# Patient Record
Sex: Female | Born: 1960 | Race: White | Hispanic: No | Smoking: Never smoker
Health system: Southern US, Community
[De-identification: ages and names within clinical notes are randomized; demographics above are authoritative.]

## PROBLEM LIST (undated history)

## (undated) DIAGNOSIS — R000 Tachycardia, unspecified: Secondary | ICD-10-CM

## (undated) DIAGNOSIS — F329 Major depressive disorder, single episode, unspecified: Secondary | ICD-10-CM

## (undated) DIAGNOSIS — F32A Depression, unspecified: Secondary | ICD-10-CM

## (undated) DIAGNOSIS — E7800 Pure hypercholesterolemia, unspecified: Secondary | ICD-10-CM

## (undated) DIAGNOSIS — I471 Supraventricular tachycardia, unspecified: Secondary | ICD-10-CM

## (undated) HISTORY — PX: EYE SURGERY: SHX253

## (undated) HISTORY — PX: UTERINE FIBROID SURGERY: SHX826

## (undated) HISTORY — PX: ANKLE SURGERY: SHX546

---

## 2006-10-12 ENCOUNTER — Emergency Department (HOSPITAL_COMMUNITY): Admission: EM | Admit: 2006-10-12 | Discharge: 2006-10-12 | Payer: Self-pay | Admitting: Emergency Medicine

## 2007-05-13 ENCOUNTER — Emergency Department (HOSPITAL_COMMUNITY): Admission: EM | Admit: 2007-05-13 | Discharge: 2007-05-13 | Payer: Self-pay | Admitting: Emergency Medicine

## 2008-02-22 ENCOUNTER — Emergency Department (HOSPITAL_COMMUNITY): Admission: EM | Admit: 2008-02-22 | Discharge: 2008-02-22 | Payer: Self-pay | Admitting: Emergency Medicine

## 2011-01-02 ENCOUNTER — Emergency Department (HOSPITAL_COMMUNITY)
Admission: EM | Admit: 2011-01-02 | Discharge: 2011-01-02 | Disposition: A | Discharge status: Home / Self Care | Payer: 59 | Attending: Emergency Medicine | Admitting: Emergency Medicine

## 2011-01-02 DIAGNOSIS — I498 Other specified cardiac arrhythmias: Secondary | ICD-10-CM | POA: Insufficient documentation

## 2011-01-02 DIAGNOSIS — F3289 Other specified depressive episodes: Secondary | ICD-10-CM | POA: Insufficient documentation

## 2011-01-02 DIAGNOSIS — R079 Chest pain, unspecified: Secondary | ICD-10-CM | POA: Insufficient documentation

## 2011-01-02 DIAGNOSIS — F329 Major depressive disorder, single episode, unspecified: Secondary | ICD-10-CM | POA: Insufficient documentation

## 2011-01-02 LAB — DIFFERENTIAL
Basophils Relative: 0 % (ref 0–1)
Eosinophils Absolute: 0.3 10*3/uL (ref 0.0–0.7)
Monocytes Absolute: 0.5 10*3/uL (ref 0.1–1.0)
Monocytes Relative: 5 % (ref 3–12)

## 2011-01-02 LAB — COMPREHENSIVE METABOLIC PANEL
ALT: 22 U/L (ref 0–35)
CO2: 25 mEq/L (ref 19–32)
Calcium: 9.2 mg/dL (ref 8.4–10.5)
Chloride: 109 mEq/L (ref 96–112)
GFR calc non Af Amer: >60 mL/min (ref >60)
Glucose, Bld: 108 mg/dL — ABNORMAL HIGH (ref 70–99)
Sodium: 141 mEq/L (ref 135–145)
Total Bilirubin: 0.3 mg/dL (ref 0.3–1.2)

## 2011-01-02 LAB — POCT I-STAT, CHEM 8
Calcium, Ion: 1.16 mmol/L (ref 1.12–1.32)
Creatinine, Ser: 1 mg/dL (ref 0.4–1.2)
Hemoglobin: 14.3 g/dL (ref 12.0–15.0)
Sodium: 142 mEq/L (ref 135–145)
TCO2: 24 mmol/L (ref 0–100)

## 2011-01-02 LAB — POCT CARDIAC MARKERS: Myoglobin, poc: 58.7 ng/mL (ref 12–200)

## 2011-01-02 LAB — CBC
MCH: 30 pg (ref 26.0–34.0)
MCHC: 32.8 g/dL (ref 30.0–36.0)
Platelets: 286 10*3/uL (ref 150–400)

## 2011-07-30 LAB — RAPID STREP SCREEN (MED CTR MEBANE ONLY): Streptococcus, Group A Screen (Direct): NEGATIVE

## 2011-08-20 LAB — I-STAT 8, (EC8 V) (CONVERTED LAB)
BUN: 9
Chloride: 106
HCT: 41
Hemoglobin: 13.9
Operator id: 285841
Potassium: 3.9
Sodium: 140

## 2011-08-20 LAB — POCT I-STAT CREATININE: Creatinine, Ser: 0.7

## 2011-08-20 LAB — POCT CARDIAC MARKERS: CKMB, poc: 1.4

## 2012-11-03 NOTE — H&P (Signed)
Charese Abundis  DICTATION # [id number] CSN# [id number]   Meriel Pica, MD 11/03/2012 9:54 AM

## 2012-11-03 NOTE — Progress Notes (Signed)
Addendum to H&P>>>      abd [place] dated 02/2012 showed nl pelvic findings with diffuse liver changes c/w fatty liver changes.

## 2012-11-05 NOTE — H&P (Signed)
Christine Fletcher, Christine Fletcher               ACCOUNT NO.:  [id number]  MEDICAL RECORD NO.:  [id number]  LOCATION:                                 FACILITY:  PHYSICIAN:  Duke Salvia. Christine Fletcher, M.D.DATE OF BIRTH:  02/03/1961  DATE OF ADMISSION: DATE OF DISCHARGE:                             HISTORY & PHYSICAL   CHIEF COMPLAINT:  Dyspareunia, uterine prolapse.  HISTORY OF PRESENT ILLNESS:  A 52 year old postmenopausal, G0, P0.  The patient was seen by her family doctor in early 2013, complaining of dyspareunia and the sensation that something was falling out.  No history of issue.  Her GYN history is significant for Pfannenstiel myomectomy in 1997.  Review of that operative notes from [facility], [place], Dr. Kyla Balzarine in 1997, revealed that she had a solitary large pedunculated posterior leiomyoma on a pedicle that was ligated and removed, no other abnormalities were noted.  The remainder of the pelvic exam was normal.  Examination in our office revealed that she does have mild-to-moderate uterine descensus.  The uterus was otherwise normal size with a cystocele on straining, her posterior support was normal. Significant medical history reveals history of alcoholism.  She has been sober since 2004.  This procedure including specific risks related to bleeding, infection, transfusion, the possible need to complete surgery by open technique, other risks related to wound infection, phlebitis, and her expected recovery time, possible need for ERT reviewed with her, which she understands and accepts.  PAST MEDICAL HISTORY:  ALLERGIES:  MACROBID.  CURRENT MEDICATIONS:  Prilosec, atenolol, calcium, vitamin D, clobetasol topical, Prozac, Mobic, Pravachol, and Wellbutrin.  SURGICAL HISTORY:  She had eye surgery in 1983, myomectomy in 1997, broken ankle fixation in 2005.  REVIEW OF SYSTEMS:  Otherwise remarkable for history of heart disease, arthritis.  FAMILY HISTORY:  Significant for heart  disease, asthma, peptic ulcer, UTI, diverticulosis, arthritis, diabetes, hypertension, and cervical and colon cancer.  SOCIAL HISTORY:  Christine Fletcher, nurse practitioner is her medical doctor.  She is widowed.  Denies alcohol, drug, or tobacco use.  She states that she has been sober since 2004.  PHYSICAL EXAMINATION:  VITAL SIGNS:  Temperature 98.2, blood pressure 128/90. HEENT:  Unremarkable. NECK:  Supple without masses. LUNGS:  Clear. CARDIOVASCULAR:  Regular rate and rhythm without murmurs, rubs, or gallops. BREASTS:  Without masses. ABDOMEN:  Soft, flat, and nontender.  Vulva unremarkable.  Uterus is normal size.  I am straining the uterus dropped to mild-to-moderate degree with an anterior cystocele noted.  Bimanual otherwise unremarkable.  IMPRESSION:  Uterine descensus with dyspareunia and cystocele, symptomatic.  PLAN:  LAVH, BSO, anterior repair.  Procedure and risks discussed as above.     Christine Fletcher, M.D.     [facility]  D:  11/03/2012  T:  11/03/2012  Job:  [id number]

## 2012-11-09 ENCOUNTER — Encounter (HOSPITAL_COMMUNITY): Admission: RE | Payer: Self-pay | Source: Ambulatory Visit

## 2012-11-09 ENCOUNTER — Ambulatory Visit (HOSPITAL_COMMUNITY): Admission: RE | Admit: 2012-11-09 | Payer: 59 | Source: Ambulatory Visit | Admitting: Obstetrics and Gynecology

## 2012-11-09 SURGERY — HYSTERECTOMY, VAGINAL, LAPAROSCOPY-ASSISTED
Anesthesia: General

## 2013-02-12 ENCOUNTER — Other Ambulatory Visit: Payer: Self-pay | Admitting: Family Medicine

## 2013-02-12 DIAGNOSIS — Z780 Asymptomatic menopausal state: Secondary | ICD-10-CM

## 2013-02-12 DIAGNOSIS — Z1231 Encounter for screening mammogram for malignant neoplasm of breast: Secondary | ICD-10-CM

## 2013-03-15 ENCOUNTER — Other Ambulatory Visit: Payer: 59

## 2013-03-15 ENCOUNTER — Ambulatory Visit: Payer: 59

## 2013-04-16 ENCOUNTER — Ambulatory Visit: Payer: 59

## 2013-04-16 ENCOUNTER — Other Ambulatory Visit: Payer: 59

## 2014-09-05 ENCOUNTER — Encounter (HOSPITAL_COMMUNITY): Payer: Self-pay

## 2014-09-05 ENCOUNTER — Emergency Department (HOSPITAL_COMMUNITY)
Admission: EM | Admit: 2014-09-05 | Discharge: 2014-09-05 | Disposition: A | Discharge status: Home / Self Care | Payer: 59 | Attending: Emergency Medicine | Admitting: Emergency Medicine

## 2014-09-05 ENCOUNTER — Emergency Department (HOSPITAL_COMMUNITY): Payer: 59

## 2014-09-05 DIAGNOSIS — Z8639 Personal history of other endocrine, nutritional and metabolic disease: Secondary | ICD-10-CM | POA: Diagnosis not present

## 2014-09-05 DIAGNOSIS — R000 Tachycardia, unspecified: Secondary | ICD-10-CM | POA: Diagnosis present

## 2014-09-05 DIAGNOSIS — I471 Supraventricular tachycardia: Secondary | ICD-10-CM | POA: Insufficient documentation

## 2014-09-05 DIAGNOSIS — Z8659 Personal history of other mental and behavioral disorders: Secondary | ICD-10-CM | POA: Diagnosis not present

## 2014-09-05 HISTORY — DX: Pure hypercholesterolemia, unspecified: E78.00

## 2014-09-05 HISTORY — DX: Tachycardia, unspecified: R00.0

## 2014-09-05 HISTORY — DX: Major depressive disorder, single episode, unspecified: F32.9

## 2014-09-05 HISTORY — DX: Depression, unspecified: F32.A

## 2014-09-05 LAB — BASIC METABOLIC PANEL
ANION GAP: 15 (ref 5–15)
BUN: 16 mg/dL (ref 6–23)
CO2: 26 mEq/L (ref 19–32)
Calcium: 9.3 mg/dL (ref 8.4–10.5)
Chloride: 100 mEq/L (ref 96–112)
Creatinine, Ser: 0.89 mg/dL (ref 0.50–1.10)
GFR, EST AFRICAN AMERICAN: 83 mL/min — AB (ref >90)
GFR, EST NON AFRICAN AMERICAN: 72 mL/min — AB (ref >90)
Glucose, Bld: 142 mg/dL — ABNORMAL HIGH (ref 70–99)
Potassium: 3.5 mEq/L — ABNORMAL LOW (ref 3.7–5.3)
Sodium: 141 mEq/L (ref 137–147)

## 2014-09-05 LAB — CBC
HEMATOCRIT: 40.8 % (ref 36.0–46.0)
Hemoglobin: 13.8 g/dL (ref 12.0–15.0)
MCH: 29.9 pg (ref 26.0–34.0)
MCHC: 33.8 g/dL (ref 30.0–36.0)
MCV: 88.5 fL (ref 78.0–100.0)
Platelets: 362 10*3/uL (ref 150–400)
RBC: 4.61 MIL/uL (ref 3.87–5.11)
RDW: 13.4 % (ref 11.5–15.5)
WBC: 12.7 10*3/uL — ABNORMAL HIGH (ref 4.0–10.5)

## 2014-09-05 LAB — I-STAT TROPONIN, ED: Troponin i, poc: 0.02 ng/mL (ref 0.00–0.08)

## 2014-09-05 MED ORDER — ADENOSINE 6 MG/2ML IV SOLN
12.0000 mg | Freq: Once | INTRAVENOUS | Status: AC
  Administered 2014-09-05: 12 mg via INTRAVENOUS

## 2014-09-05 MED ORDER — ADENOSINE 6 MG/2ML IV SOLN
6.0000 mg | Freq: Once | INTRAVENOUS | Status: AC
  Administered 2014-09-05: 6 mg via INTRAVENOUS

## 2014-09-05 MED ORDER — ADENOSINE 6 MG/2ML IV SOLN
INTRAVENOUS | Status: AC
  Filled 2014-09-05: qty 6

## 2014-09-05 NOTE — ED Provider Notes (Signed)
CSN: [id number]     Arrival date & time 09/05/14  0130 History   First MD Initiated Contact with Patient 09/05/14 0143     Chief Complaint  Patient presents with  . Tachycardia     (Consider location/radiation/quality/duration/timing/severity/associated sxs/prior Treatment) The history is provided by the patient.  53 year old female presents with rapid heartbeat which started about 30 minutes ago. This is associated with a burning feeling in her chest and mild nausea. There's been no dyspnea or diaphoresis. She has had similar episode once before which required a trip to the hospital. She tried Valsalva maneuver,And tried splashing her face with cold water without any improvement.   Past Medical History  Diagnosis Date  . Tachycardia   . Depression   . High cholesterol    Past Surgical History  Procedure Laterality Date  . Ankle surgery    . Eye surgery      three surgeries   History reviewed. No pertinent family history. History  Substance Use Topics  . Smoking status: Never Smoker   . Smokeless tobacco: Not on file  . Alcohol Use: No   OB History    No data available     Review of Systems  All other systems reviewed and are negative.     Allergies  Macrobid  Home Medications   Prior to Admission medications   Not on File   BP 117/90 mmHg  Pulse 212  Temp(Src) 97.8 F (36.6 C) (Oral)  Resp 18  SpO2 94% Physical Exam  Nursing note and vitals reviewed.  53 year old female, resting comfortably and in no acute distress. Vital signs are significant for extreme tachycardia. Oxygen saturation is 94%, which is normal. Head is normocephalic and atraumatic. PERRLA, EOMI. Oropharynx is clear. Neck is nontender and supple without adenopathy or JVD. Back is nontender and there is no CVA tenderness. Lungs are clear without rales, wheezes, or rhonchi. Chest is nontender. Heart his tachycardic without murmur. Abdomen is soft, flat, nontender without masses or  hepatosplenomegaly and peristalsis is normoactive. Extremities have no cyanosis or edema, full range of motion is present. Skin is warm and dry without rash. Neurologic: Mental status is normal, cranial nerves are intact, there are no motor or sensory deficits.  ED Course  Procedures (including critical care time) Procedure: Chemical cardioversion Indication: Supraventricular tachycardia Prior to procedure being done, at timeout was performed Description: She was given adenosine 6 mg intravenously with no appreciable effect on her heart rate. This was followed by adenosine 12 mg with successful conversion to sinus tachycardia. Sinus tachycardia initially was at a rate of 145 but has slowly decreased to 110. She has noted subjective improvement with this. Patient tolerated procedure well.  Labs Review Results for orders placed or performed during the hospital encounter of 09/05/14  CBC  Result Value Ref Range   WBC 12.7 (H) 4.0 - 10.5 K/uL   RBC 4.61 3.87 - 5.11 MIL/uL   Hemoglobin 13.8 12.0 - 15.0 g/dL   HCT 42.740.8 06.236.0 - 37.646.0 %   MCV 88.5 78.0 - 100.0 fL   MCH 29.9 26.0 - 34.0 pg   MCHC 33.8 30.0 - 36.0 g/dL   RDW 28.313.4 15.111.5 - 76.115.5 %   Platelets 362 150 - 400 K/uL  Basic metabolic panel  Result Value Ref Range   Sodium 141 137 - 147 mEq/L   Potassium 3.5 (L) 3.7 - 5.3 mEq/L   Chloride 100 96 - 112 mEq/L   CO2 26 19 - 32  mEq/L   Glucose, Bld 142 (H) 70 - 99 mg/dL   BUN 16 6 - 23 mg/dL   Creatinine, Ser 1.610.89 0.50 - 1.10 mg/dL   Calcium 9.3 8.4 - 09.610.5 mg/dL   GFR calc non Af Amer 72 (L) >90 mL/min   GFR calc Af Amer 83 (L) >90 mL/min   Anion gap 15 5 - 15  I-stat troponin, ED ([facility])  Result Value Ref Range   Troponin i, poc 0.02 0.00 - 0.08 ng/mL   Comment 3           EKG Interpretation   Date/Time:  Monday September 05 2014 01:40:25 EST Ventricular Rate:  215 PR Interval:    QRS Duration: 82 QT Interval:  226 QTC Calculation: 427 R Axis:   174 Text Interpretation:   Supraventricular tachycardia Right axis deviation  Pulmonary disease pattern Right ventricular hypertrophy Nonspecific ST  abnormality Abnormal ECG When compared with ECG of 01/02/2011, No  significant change was found Confirmed by [facility]  MD, Kollyns Mickelson ([id number]) on  09/05/2014 1:45:34 AM      CRITICAL CARE Performed by: JWJXB,JYNWGGLICK,Samaria Anes Total critical care time: 35 minutes Critical care time was exclusive of separately billable procedures and treating other patients. Critical care was necessary to treat or prevent imminent or life-threatening deterioration. Critical care was time spent personally by me on the following activities: development of treatment plan with patient and/or surrogate as well as nursing, discussions with consultants, evaluation of patient's response to treatment, examination of patient, obtaining history from patient or surrogate, ordering and performing treatments and interventions, ordering and review of laboratory studies, ordering and review of radiographic studies, pulse oximetry and re-evaluation of patient's condition.  MDM   Final diagnoses:  None    Paroxysmal supraventricular tachycardia. Old records are reviewed and she has a prior ED visit for similar condition. She is given 2 doses of adenosine with successful conversion to sinus rhythm.  2:43 AM Patient is resting comfortably in heart rate is down to 95. She has had no further symptoms and is discharged.  Dione Boozeavid Evadean Sproule, MD 09/05/14 [phone]

## 2014-09-05 NOTE — Discharge Instructions (Signed)
Supraventricular Tachycardia °Supraventricular tachycardia (SVT) is an abnormal heart rhythm (arrhythmia) that causes the heart to beat very fast (tachycardia). This kind of fast heartbeat originates in the upper chambers of the heart (atria). SVT can cause the heart to beat greater than 100 beats per minute. SVT can have a rapid burst of heartbeats. This can start and stop suddenly without warning and is called nonsustained. SVT can also be sustained, in which the heart beats at a continuous fast rate.  °CAUSES  °There can be different causes of SVT. Some of these include: °· Heart valve problems such as mitral valve prolapse. °· An enlarged heart (hypertrophic cardiomyopathy). °· Congenital heart problems. °· Heart inflammation (pericarditis). °· Hyperthyroidism. °· Low potassium or magnesium levels. °· Caffeine. °· Drug use such as cocaine, methamphetamines, or stimulants. °· Some over-the-counter medicines such as: °¨ Decongestants. °¨ Diet medicines. °¨ Herbal medicines. °SYMPTOMS  °Symptoms of SVT can vary. Symptoms depend on whether the SVT is sustained or nonsustained. You may experience: °· No symptoms (asymptomatic). °· An awareness of your heart beating rapidly (palpitations). °· Shortness of breath. °· Chest pain or pressure. °If your blood pressure drops because of the SVT, you may experience: °· Fainting or near fainting. °· Weakness. °· Dizziness. °DIAGNOSIS  °Different tests can be performed to diagnose SVT, such as: °· An electrocardiogram (EKG). This is a painless test that records the electrical activity of your heart. °· Holter monitor. This is a 24 hour recording of your heart rhythm. You will be given a diary. Write down all symptoms that you have and what you were doing at the time you experienced symptoms. °· Arrhythmia monitor. This is a small device that your wear for several weeks. It records the heart rhythm when you have symptoms. °· Echocardiogram. This is an imaging test to help detect  abnormal heart structure such as congenital abnormalities, heart valve problems, or heart enlargement. °· Stress test. This test can help determine if the SVT is related to exercise. °· Electrophysiology study (EPS). This is a procedure that evaluates your heart's electrical system and can help your caregiver find the cause of your SVT. °TREATMENT  °Treatment of SVT depends on the symptoms, how often it recurs, and whether there are any underlying heart problems.  °· If symptoms are rare and no other cardiac disease is present, no treatment may be needed. °· Blood work may be done to check potassium, magnesium, and thyroid hormone levels to see if they are abnormal. If these levels are abnormal, treatment to correct the problems will occur. °Medicines °Your caregiver may use oral medicines to treat SVT. These medicines are given for long-term control of SVT. Medicines may be used alone or in combination with other treatments. These medicines work to slow nerve impulses in the heart muscle. These medicines can also be used to treat high blood pressure. Some of these medicines may include: °· Calcium channel blockers. °· Beta blockers. °· Digoxin. °Nonsurgical procedures °Nonsurgical techniques may be used if oral medicines do not work. Some examples include: °· Cardioversion. This technique uses either drugs or an electrical shock to restore a normal heart rhythm. °¨ Cardioversion drugs may be given through an intravenous (IV) line to help "reset" the heart rhythm. °¨ In electrical cardioversion, the caregiver shocks your heart to stop its beat for a split second. This helps to reset the heart to a normal rhythm. °· Ablation. This procedure is done under mild sedation. High frequency radio wave energy is used to   destroy the area of heart tissue responsible for the SVT. °HOME CARE INSTRUCTIONS  °· Do not smoke. °· Only take medicines prescribed by your caregiver. Check with your caregiver before using over-the-counter  medicines. °· Check with your caregiver about how much alcohol and caffeine (coffee, tea, colas, or chocolate) you may have. °· It is very important to keep all follow-up referrals and appointments in order to properly manage this problem. °SEEK IMMEDIATE MEDICAL CARE IF: °· You have dizziness. °· You faint or nearly faint. °· You have shortness of breath. °· You have chest pain or pressure. °· You have sudden nausea or vomiting. °· You have profuse sweating. °· You are concerned about how long your symptoms last. °· You are concerned about the frequency of your SVT episodes. °If you have the above symptoms, call your local emergency services (911 in [place] immediately. Do not drive yourself to the hospital. °MAKE SURE YOU:  °· Understand these instructions. °· Will watch your condition. °· Will get help right away if you are not doing well or get worse. °Document Released: 10/21/2005 Document Revised: 01/13/2012 Document Reviewed: 02/02/2009 °ExitCare® Patient Information ©2015 [employer]. This information is not intended to replace advice given to you by your health care provider. Make sure you discuss any questions you have with your health care provider. ° °

## 2014-09-05 NOTE — ED Notes (Signed)
Pt presents with a "tachtcardia." Pt states it feels like her heart is "running" and "burning" to her chest. Pt denies SOB, diaphoresis, N/V. Pt's HR is 215 in triage, EKG  Completed in triage

## 2014-09-05 NOTE — ED Notes (Signed)
HR 113

## 2014-09-05 NOTE — ED Notes (Signed)
HR remains 210

## 2015-06-16 ENCOUNTER — Emergency Department (HOSPITAL_COMMUNITY)
Admission: EM | Admit: 2015-06-16 | Discharge: 2015-06-16 | Disposition: A | Discharge status: Home / Self Care | Payer: 59 | Attending: Emergency Medicine | Admitting: Emergency Medicine

## 2015-06-16 ENCOUNTER — Encounter (HOSPITAL_COMMUNITY): Payer: Self-pay

## 2015-06-16 DIAGNOSIS — Z79899 Other long term (current) drug therapy: Secondary | ICD-10-CM | POA: Insufficient documentation

## 2015-06-16 DIAGNOSIS — F329 Major depressive disorder, single episode, unspecified: Secondary | ICD-10-CM | POA: Insufficient documentation

## 2015-06-16 DIAGNOSIS — E780 Pure hypercholesterolemia: Secondary | ICD-10-CM | POA: Insufficient documentation

## 2015-06-16 DIAGNOSIS — I471 Supraventricular tachycardia: Secondary | ICD-10-CM

## 2015-06-16 HISTORY — DX: Supraventricular tachycardia, unspecified: I47.10

## 2015-06-16 HISTORY — DX: Supraventricular tachycardia: I47.1

## 2015-06-16 LAB — I-STAT CHEM 8, ED
BUN: 18 mg/dL (ref 6–20)
CREATININE: 0.7 mg/dL (ref 0.44–1.00)
Calcium, Ion: 1.16 mmol/L (ref 1.12–1.23)
Chloride: 108 mmol/L (ref 101–111)
Glucose, Bld: 91 mg/dL (ref 65–99)
HCT: 39 % (ref 36.0–46.0)
Hemoglobin: 13.3 g/dL (ref 12.0–15.0)
Potassium: 3.8 mmol/L (ref 3.5–5.1)
Sodium: 141 mmol/L (ref 135–145)
TCO2: 21 mmol/L (ref 0–100)

## 2015-06-16 LAB — I-STAT TROPONIN, ED: TROPONIN I, POC: 0.01 ng/mL (ref 0.00–0.08)

## 2015-06-16 NOTE — ED Notes (Signed)
Pt. Ambulated independently to the bathroom with steady gait.

## 2015-06-16 NOTE — ED Provider Notes (Signed)
CSN: [id number]     Arrival date & time 06/16/15  0906 History   First MD Initiated Contact with Patient 06/16/15 [phone]     Chief Complaint  Patient presents with  . Tachycardia     The history is provided by the patient. No language interpreter was used.   Christine Fletcher presents for evaluation of rapid heartbeat. At 7:30 this morning she developed palpitations and called 911. On EMS arrival she had SVT with a heart rate in the 200s. She was noted to be pale and diaphoretic per EMS. She received 6 mg of Benicar and prior to ED arrival with conversion to a sinus tach, rate in the low 100s. Patient reports she is currently asymptomatic. She has had some diarrhea for the last 2 days, no hematochezia. She denies any chest pain, shortness of breath, fevers, vomiting, dysuria. She has no other medical problems and takes no prescribed medications. She does not smoke, she is an occasional alcohol drinker, no drug use.  Past Medical History  Diagnosis Date  . Tachycardia   . Depression   . High cholesterol   . SVT (supraventricular tachycardia)    Past Surgical History  Procedure Laterality Date  . Ankle surgery    . Eye surgery      three surgeries   No family history on file. Social History  Substance Use Topics  . Smoking status: Never Smoker   . Smokeless tobacco: None  . Alcohol Use: Yes     Comment: occasionally   OB History    No data available     Review of Systems  All other systems reviewed and are negative.     Allergies  Macrobid  Home Medications   Prior to Admission medications   Medication Sig Start Date End Date Taking? Authorizing Provider  atenolol (TENORMIN) 25 MG tablet Take 25 mg by mouth daily.    Historical Provider, MD  buPROPion (WELLBUTRIN SR) 150 MG 12 hr tablet Take 150 mg by mouth 2 (two) times daily.    Historical Provider, MD  Calcium Citrate-Vitamin D (CALCIUM CITRATE + D3 PO) Take 2 tablets by mouth 2 (two) times daily.    Historical Provider,  MD  Cholecalciferol (VITAMIN D-3) 1000 UNITS CAPS Take 1,000 Units by mouth.    Historical Provider, MD  diclofenac (VOLTAREN) 75 MG EC tablet Take 75 mg by mouth daily.    Historical Provider, MD  FLUoxetine (PROZAC) 40 MG capsule Take 40 mg by mouth daily.    Historical Provider, MD  ibuprofen (ADVIL,MOTRIN) 200 MG tablet Take 600 mg by mouth every 6 (six) hours as needed for moderate pain.    Historical Provider, MD  Omega-3 Fatty Acids (FISH OIL) 1200 MG CAPS Take 2,400 mg by mouth daily.    Historical Provider, MD  omeprazole (PRILOSEC) 40 MG capsule Take 40 mg by mouth daily.    Historical Provider, MD  pravastatin (PRAVACHOL) 40 MG tablet Take 40 mg by mouth every other day.    Historical Provider, MD   BP 110/63 mmHg  Pulse 100  Temp(Src) 97.7 F (36.5 C) (Oral)  Resp 24  SpO2 97% Physical Exam  Constitutional: She is oriented to person, place, and time. She appears well-developed and well-nourished.  HENT:  Head: Normocephalic and atraumatic.  Cardiovascular: Regular rhythm.   No murmur heard. Tachycardic  Pulmonary/Chest: Effort normal and breath sounds normal. No respiratory distress.  Abdominal: Soft. There is no tenderness. There is no rebound and no guarding.  Musculoskeletal:  She exhibits no edema or tenderness.  Neurological: She is alert and oriented to person, place, and time.  Skin: Skin is warm and dry.  Psychiatric: She has a normal mood and affect. Her behavior is normal.  Nursing note and vitals reviewed.   ED Course  Procedures (including critical care time) Labs Review Labs Reviewed  I-STAT CHEM 8, ED  I-STAT TROPOININ, ED    Imaging Review No results found. I, Kauan Kloosterman, personally reviewed and evaluated these images and lab results as part of my medical decision-making.   EKG Interpretation   Date/Time:  Friday June 16 2015 09:14:05 EDT Ventricular Rate:  101 PR Interval:  170 QRS Duration: 92 QT Interval:  352 QTC Calculation:  456 R Axis:   121 Text Interpretation:  Sinus tachycardia Right axis deviation RSR' in V1 or  V2, probably normal variant Baseline wander in lead(s) II III aVF  Confirmed by Lincoln Brigham [phone]) on 06/16/2015 9:22:22 AM      MDM   Final diagnoses:  SVT (supraventricular tachycardia)    Patient here for evaluation of SVT/tachycardia. She was chemically converted by EMS prior to arrival. Reviewed patient's EMS run strips that noted narrow complex SVT. Patient is asymptomatic in the emergency department with no evidence of major electrolyte abnormalities. Clinical picture is not consistent with ACS, PE, CHF. Discussed the patient home care for SVT, cardiology follow-up, return precautions.    Tilden Fossa, MD 06/16/15 1108

## 2015-06-16 NOTE — Discharge Instructions (Signed)
Supraventricular Tachycardia °Supraventricular tachycardia (SVT) is an abnormal heart rhythm (arrhythmia) that causes the heart to beat very fast (tachycardia). This kind of fast heartbeat originates in the upper chambers of the heart (atria). SVT can cause the heart to beat greater than 100 beats per minute. SVT can have a rapid burst of heartbeats. This can start and stop suddenly without warning and is called nonsustained. SVT can also be sustained, in which the heart beats at a continuous fast rate.  °CAUSES  °There can be different causes of SVT. Some of these include: °· Heart valve problems such as mitral valve prolapse. °· An enlarged heart (hypertrophic cardiomyopathy). °· Congenital heart problems. °· Heart inflammation (pericarditis). °· Hyperthyroidism. °· Low potassium or magnesium levels. °· Caffeine. °· Drug use such as cocaine, methamphetamines, or stimulants. °· Some over-the-counter medicines such as: °¨ Decongestants. °¨ Diet medicines. °¨ Herbal medicines. °SYMPTOMS  °Symptoms of SVT can vary. Symptoms depend on whether the SVT is sustained or nonsustained. You may experience: °· No symptoms (asymptomatic). °· An awareness of your heart beating rapidly (palpitations). °· Shortness of breath. °· Chest pain or pressure. °If your blood pressure drops because of the SVT, you may experience: °· Fainting or near fainting. °· Weakness. °· Dizziness. °DIAGNOSIS  °Different tests can be performed to diagnose SVT, such as: °· An electrocardiogram (EKG). This is a painless test that records the electrical activity of your heart. °· Holter monitor. This is a 24 hour recording of your heart rhythm. You will be given a diary. Write down all symptoms that you have and what you were doing at the time you experienced symptoms. °· Arrhythmia monitor. This is a small device that your wear for several weeks. It records the heart rhythm when you have symptoms. °· Echocardiogram. This is an imaging test to help detect  abnormal heart structure such as congenital abnormalities, heart valve problems, or heart enlargement. °· Stress test. This test can help determine if the SVT is related to exercise. °· Electrophysiology study (EPS). This is a procedure that evaluates your heart's electrical system and can help your caregiver find the cause of your SVT. °TREATMENT  °Treatment of SVT depends on the symptoms, how often it recurs, and whether there are any underlying heart problems.  °· If symptoms are rare and no other cardiac disease is present, no treatment may be needed. °· Blood work may be done to check potassium, magnesium, and thyroid hormone levels to see if they are abnormal. If these levels are abnormal, treatment to correct the problems will occur. °Medicines °Your caregiver may use oral medicines to treat SVT. These medicines are given for long-term control of SVT. Medicines may be used alone or in combination with other treatments. These medicines work to slow nerve impulses in the heart muscle. These medicines can also be used to treat high blood pressure. Some of these medicines may include: °· Calcium channel blockers. °· Beta blockers. °· Digoxin. °Nonsurgical procedures °Nonsurgical techniques may be used if oral medicines do not work. Some examples include: °· Cardioversion. This technique uses either drugs or an electrical shock to restore a normal heart rhythm. °¨ Cardioversion drugs may be given through an intravenous (IV) line to help "reset" the heart rhythm. °¨ In electrical cardioversion, the caregiver shocks your heart to stop its beat for a split second. This helps to reset the heart to a normal rhythm. °· Ablation. This procedure is done under mild sedation. High frequency radio wave energy is used to   destroy the area of heart tissue responsible for the SVT. °HOME CARE INSTRUCTIONS  °· Do not smoke. °· Only take medicines prescribed by your caregiver. Check with your caregiver before using over-the-counter  medicines. °· Check with your caregiver about how much alcohol and caffeine (coffee, tea, colas, or chocolate) you may have. °· It is very important to keep all follow-up referrals and appointments in order to properly manage this problem. °SEEK IMMEDIATE MEDICAL CARE IF: °· You have dizziness. °· You faint or nearly faint. °· You have shortness of breath. °· You have chest pain or pressure. °· You have sudden nausea or vomiting. °· You have profuse sweating. °· You are concerned about how long your symptoms last. °· You are concerned about the frequency of your SVT episodes. °If you have the above symptoms, call your local emergency services (911 in [place] immediately. Do not drive yourself to the hospital. °MAKE SURE YOU:  °· Understand these instructions. °· Will watch your condition. °· Will get help right away if you are not doing well or get worse. °Document Released: 10/21/2005 Document Revised: 01/13/2012 Document Reviewed: 02/02/2009 °ExitCare® Patient Information ©2015 [employer]. This information is not intended to replace advice given to you by your health care provider. Make sure you discuss any questions you have with your health care provider. ° °

## 2015-06-16 NOTE — ED Notes (Signed)
Pt. Presents for SVT. Rate initially 204, [facility] administered 324 ASA and 6 mg of adenosine, pt. Converted PTA. HR now 94. Pt. With positive hx of SVT episodes, never had ablation or been electrically converted. Pt. Found to be diaphoretic with CP on EMS arrival. All symptoms resolved at this time.

## 2016-05-24 ENCOUNTER — Emergency Department (HOSPITAL_COMMUNITY)
Admission: EM | Admit: 2016-05-24 | Discharge: 2016-05-24 | Disposition: A | Discharge status: Home / Self Care | Payer: BLUE CROSS/BLUE SHIELD | Attending: Emergency Medicine | Admitting: Emergency Medicine

## 2016-05-24 ENCOUNTER — Encounter (HOSPITAL_COMMUNITY): Payer: Self-pay | Admitting: Emergency Medicine

## 2016-05-24 DIAGNOSIS — S80862A Insect bite (nonvenomous), left lower leg, initial encounter: Secondary | ICD-10-CM | POA: Diagnosis present

## 2016-05-24 DIAGNOSIS — F329 Major depressive disorder, single episode, unspecified: Secondary | ICD-10-CM | POA: Insufficient documentation

## 2016-05-24 DIAGNOSIS — Y929 Unspecified place or not applicable: Secondary | ICD-10-CM | POA: Diagnosis not present

## 2016-05-24 DIAGNOSIS — L0291 Cutaneous abscess, unspecified: Secondary | ICD-10-CM

## 2016-05-24 DIAGNOSIS — L02416 Cutaneous abscess of left lower limb: Secondary | ICD-10-CM | POA: Diagnosis not present

## 2016-05-24 DIAGNOSIS — Y999 Unspecified external cause status: Secondary | ICD-10-CM | POA: Insufficient documentation

## 2016-05-24 DIAGNOSIS — Y939 Activity, unspecified: Secondary | ICD-10-CM | POA: Diagnosis not present

## 2016-05-24 DIAGNOSIS — W57XXXA Bitten or stung by nonvenomous insect and other nonvenomous arthropods, initial encounter: Secondary | ICD-10-CM | POA: Diagnosis not present

## 2016-05-24 DIAGNOSIS — L0390 Cellulitis, unspecified: Secondary | ICD-10-CM

## 2016-05-24 MED ORDER — DOXYCYCLINE HYCLATE 100 MG PO CAPS: 100.0000 mg | ORAL_CAPSULE | Freq: Two times a day (BID) | ORAL | Status: DC

## 2016-05-24 MED ORDER — SODIUM BICARBONATE 4 % IV SOLN
5.0000 mL | Freq: Once | INTRAVENOUS | Status: AC
  Administered 2016-05-24: 5 mL via SUBCUTANEOUS
  Filled 2016-05-24 (×2): qty 5

## 2016-05-24 MED ORDER — ONDANSETRON 4 MG PO TBDP
4.0000 mg | ORAL_TABLET | Freq: Once | ORAL | Status: AC
  Administered 2016-05-24: 4 mg via ORAL
  Filled 2016-05-24: qty 1

## 2016-05-24 MED ORDER — LIDOCAINE HCL 1 % IJ SOLN
10.0000 mL | Freq: Once | INTRAMUSCULAR | Status: AC
  Administered 2016-05-24: 10 mL
  Filled 2016-05-24: qty 20

## 2016-05-24 MED ORDER — ONDANSETRON HCL 4 MG/2ML IJ SOLN
4.0000 mg | Freq: Once | INTRAMUSCULAR | Status: DC
  Filled 2016-05-24: qty 2

## 2016-05-24 MED ORDER — TRAMADOL HCL 50 MG PO TABS: 50.0000 mg | ORAL_TABLET | Freq: Four times a day (QID) | ORAL | Status: DC | PRN

## 2016-05-24 NOTE — Discharge Instructions (Signed)
Abscess °An abscess is an infected area that contains a collection of pus and debris. It can occur in almost any part of the body. An abscess is also known as a furuncle or boil. °CAUSES  °An abscess occurs when tissue gets infected. This can occur from blockage of oil or sweat glands, infection of hair follicles, or a minor injury to the skin. As the body tries to fight the infection, pus collects in the area and creates pressure under the skin. This pressure causes pain. People with weakened immune systems have difficulty fighting infections and get certain abscesses more often.  °SYMPTOMS °Usually an abscess develops on the skin and becomes a painful mass that is red, warm, and tender. If the abscess forms under the skin, you may feel a moveable soft area under the skin. Some abscesses break open (rupture) on their own, but most will continue to get worse without care. The infection can spread deeper into the body and eventually into the bloodstream, causing you to feel ill.  °DIAGNOSIS  °Your caregiver will take your medical history and perform a physical exam. A sample of fluid may also be taken from the abscess to determine what is causing your infection. °TREATMENT  °Your caregiver may prescribe antibiotic medicines to fight the infection. However, taking antibiotics alone usually does not cure an abscess. Your caregiver may need to make a small cut (incision) in the abscess to drain the pus. In some cases, gauze is packed into the abscess to reduce pain and to continue draining the area. °HOME CARE INSTRUCTIONS  °· Only take over-the-counter or prescription medicines for pain, discomfort, or fever as directed by your caregiver. °· If you were prescribed antibiotics, take them as directed. Finish them even if you start to feel better. °· If gauze is used, follow your caregiver's directions for changing the gauze. °· To avoid spreading the infection: °· Keep your draining abscess covered with a  bandage. °· Wash your hands well. °· Do not share personal care items, towels, or whirlpools with others. °· Avoid skin contact with others. °· Keep your skin and clothes clean around the abscess. °· Keep all follow-up appointments as directed by your caregiver. °SEEK MEDICAL CARE IF:  °· You have increased pain, swelling, redness, fluid drainage, or bleeding. °· You have muscle aches, chills, or a general ill feeling. °· You have a fever. °MAKE SURE YOU:  °· Understand these instructions. °· Will watch your condition. °· Will get help right away if you are not doing well or get worse. °  °This information is not intended to replace advice given to you by your health care provider. Make sure you discuss any questions you have with your health care provider. °  °Document Released: 07/31/2005 Document Revised: 04/21/2012 Document Reviewed: 01/03/2012 °Elsevier Interactive Patient Education ©2016 Elsevier Inc. ° °Cellulitis °Cellulitis is an infection of the skin and the tissue beneath it. The infected area is usually red and tender. Cellulitis occurs most often in the arms and lower legs.  °CAUSES  °Cellulitis is caused by bacteria that enter the skin through cracks or cuts in the skin. The most common types of bacteria that cause cellulitis are staphylococci and streptococci. °SIGNS AND SYMPTOMS  °· Redness and warmth. °· Swelling. °· Tenderness or pain. °· Fever. °DIAGNOSIS  °Your health care provider can usually determine what is wrong based on a physical exam. Blood tests may also be done. °TREATMENT  °Treatment usually involves taking an antibiotic medicine. °HOME CARE INSTRUCTIONS  °·   Take your antibiotic medicine as directed by your health care provider. Finish the antibiotic even if you start to feel better.  Keep the infected arm or leg elevated to reduce swelling.  Apply a warm cloth to the affected area up to 4 times per day to relieve pain.  Take medicines only as directed by your health care  provider.  Keep all follow-up visits as directed by your health care provider. SEEK MEDICAL CARE IF:   You notice red streaks coming from the infected area.  Your red area gets larger or turns dark in color.  Your bone or joint underneath the infected area becomes painful after the skin has healed.  Your infection returns in the same area or another area.  You notice a swollen bump in the infected area.  You develop new symptoms.  You have a fever. SEEK IMMEDIATE MEDICAL CARE IF:   You feel very sleepy.  You develop vomiting or diarrhea.  You have a general ill feeling (malaise) with muscle aches and pains.   This information is not intended to replace advice given to you by your health care provider. Make sure you discuss any questions you have with your health care provider.   Document Released: 07/31/2005 Document Revised: 07/12/2015 Document Reviewed: 01/06/2012 Elsevier Interactive Patient Education 2016 Elsevier Inc. Incision and Drainage, Care After Refer to this sheet in the next few weeks. These instructions provide you with information on caring for yourself after your procedure. Your caregiver may also give you more specific instructions. Your treatment has been planned according to current medical practices, but problems sometimes occur. Call your caregiver if you have any problems or questions after your procedure. HOME CARE INSTRUCTIONS   If antibiotic medicine is given, take it as directed. Finish it even if you start to feel better.  Only take over-the-counter or prescription medicines for pain, discomfort, or fever as directed by your caregiver.  Keep all follow-up appointments as directed by your caregiver.  Change any bandages (dressings) once day or more if soiled.  Replace old dressings with clean dressings.  Wash your hands before and after caring for your wound. You will receive specific instructions for cleansing and caring for your wound.  SEEK  MEDICAL CARE IF:   You have increased pain, swelling, or redness around the wound.  You have increased drainage, smell, or bleeding from the wound.  You have muscle aches, chills, or you feel generally sick.  You have a fever. MAKE SURE YOU:   Understand these instructions.  Will watch your condition.  Will get help right away if you are not doing well or get worse.   This information is not intended to replace advice given to you by your health care provider. Make sure you discuss any questions you have with your health care provider.   Document Released: 01/13/2012 Document Revised: 11/11/2014 Document Reviewed: 01/13/2012 Elsevier Interactive Patient Education [employer]2016 Elsevier Inc.

## 2016-05-24 NOTE — ED Provider Notes (Signed)
CSN: [id number]     Arrival date & time 05/24/16  1411 History  By signing my name below, I, Christine Fletcher, attest that this documentation has been prepared under the direction and in the presence of Arthor CaptainAbigail Lief Palmatier, PA-C. Electronically Signed: Randell PatientMarrissa Fletcher, ED Scribe. 05/24/2016. 5:24 PM.    Chief Complaint  Patient presents with  . Insect Bite     The history is provided by the patient. No language interpreter was used.   HPI Comments: Christine Fletcher is a 55 y.o. female who presents to the Emergency Department complaining of constant, moderately painful, gradually increasing in redness and swelling insect bite on her left anterior lower left leg that she noticed 2 days ago. Pt states that she noticed a small, flat area of redness on her left shin 2 days ago while walking her dog that has since increased in erythema, pain, and swelling. She reports associated general malaise and warmth to affected area. Pain is worse with palpation. She has scratched the area without relief. Denies fever.  Past Medical History  Diagnosis Date  . Tachycardia   . Depression   . High cholesterol   . SVT (supraventricular tachycardia) [facility](HCC)    Past Surgical History  Procedure Laterality Date  . Ankle surgery    . Eye surgery      three surgeries   No family history on file. Social History  Substance Use Topics  . Smoking status: Never Smoker   . Smokeless tobacco: None  . Alcohol Use: Yes     Comment: occasionally   OB History    No data available     Review of Systems  Constitutional: Negative for fever.  Skin: Positive for color change (erythema) and wound (boil on lower left leg).      Allergies  Macrobid  Home Medications   Prior to Admission medications   Medication Sig Start Date End Date Taking? Authorizing Provider  doxycycline (VIBRAMYCIN) 100 MG capsule Take 1 capsule (100 mg total) by mouth 2 (two) times daily. 05/24/16   Arthor CaptainAbigail Eveny Anastas, PA-C  ibuprofen  (ADVIL,MOTRIN) 200 MG tablet Take 600 mg by mouth every 6 (six) hours as needed for moderate pain.    Historical Provider, MD  omeprazole (PRILOSEC) 40 MG capsule Take 40 mg by mouth daily.    Historical Provider, MD  traMADol (ULTRAM) 50 MG tablet Take 1 tablet (50 mg total) by mouth every 6 (six) hours as needed. 05/24/16   Iyanna Drummer, PA-C   BP 152/74 mmHg  Pulse 97  Temp(Src) 99.5 F (37.5 C) (Oral)  Resp 19  Ht 5\' 3"  (1.6 m)  Wt 165 lb (74.844 kg)  BMI 29.24 kg/m2  SpO2 95% Physical Exam  Constitutional: She is oriented to person, place, and time. She appears well-developed and well-nourished. No distress.  HENT:  Head: Normocephalic and atraumatic.  Eyes: Conjunctivae are normal.  Neck: Normal range of motion.  Cardiovascular: Normal rate.   Pulmonary/Chest: Effort normal. No respiratory distress.  Musculoskeletal: Normal range of motion.  Neurological: She is alert and oriented to person, place, and time.  Skin: Skin is warm and dry.  Psychiatric: She has a normal mood and affect. Her behavior is normal.  Nursing note and vitals reviewed.     ED Course  Procedures   DIAGNOSTIC STUDIES: Oxygen Saturation is 95% on RA, adequate by my interpretation.    COORDINATION OF CARE: 3:53 PM Will return to [place] abscess on left shin. Discussed treatment plan with pt at bedside  and pt agreed to plan.  4:03 PM Returned to [place] abscess on left shin. Will perform I&D of abscess. Will prescribe doxycycline and tramadol.  4:39 PM Returned to perform I&D of abscess.  INCISION AND DRAINAGE PROCEDURE NOTE: Patient identification was confirmed and verbal consent was obtained. This procedure was performed by  Arthor Captain, PA-C at 4:39 PM. Site: left shin Anesthetic used (type and amt): 1% lidocaine with sodium bicarbonate 4 mL Blade size: 11 blade scalpel Drainage: moderate Complexity: Complex Site anesthetized, incision made over site, wound drained and explored loculations,  covered with dry, sterile dressing. Pt tolerated procedure well without complications.  Instructions for care discussed verbally and pt provided with additional written instructions for homecare and f/u.  MDM   Final diagnoses:  Abscess and cellulitis    Patient presentation consistent with cellulitis and skin abscess of the left shin. Incision and drainage performed in the ED today.  Abscess was not large enough to warrant packing or drain placement. Afebrile. No tachycardia, hypotension or other symptoms suggestive of severe infection. Area has been demarcated and pt advised to follow up for wound check in 2-3 days, sooner for worsening systemic symptoms, new lymphangitis, or significant spread of erythema past line of demarcation. Pt was looked up in the [facility] and has no controlled substances on file. Will discharge with doxycycline and tramadol. Return precautions discussed. Pt appears safe for discharge.   I personally performed the services described in this documentation, which was scribed in my presence. The recorded information has been reviewed and is accurate.      Arthor Captain, PA-C 05/24/16 1846   Leta Baptist, MD 05/27/16 [phone]

## 2016-05-24 NOTE — ED Notes (Signed)
Per pt, states spider bite on left lower leg-noticed it on Wednesday-red, swollen, warm to tough

## 2016-05-27 ENCOUNTER — Emergency Department (HOSPITAL_COMMUNITY)
Admission: EM | Admit: 2016-05-27 | Discharge: 2016-05-27 | Disposition: A | Discharge status: Home / Self Care | Payer: BLUE CROSS/BLUE SHIELD | Attending: Emergency Medicine | Admitting: Emergency Medicine

## 2016-05-27 ENCOUNTER — Encounter (HOSPITAL_COMMUNITY): Payer: Self-pay | Admitting: Emergency Medicine

## 2016-05-27 DIAGNOSIS — Z4801 Encounter for change or removal of surgical wound dressing: Secondary | ICD-10-CM | POA: Diagnosis present

## 2016-05-27 DIAGNOSIS — E7800 Pure hypercholesterolemia, unspecified: Secondary | ICD-10-CM | POA: Insufficient documentation

## 2016-05-27 DIAGNOSIS — Z5189 Encounter for other specified aftercare: Secondary | ICD-10-CM

## 2016-05-27 DIAGNOSIS — F329 Major depressive disorder, single episode, unspecified: Secondary | ICD-10-CM | POA: Insufficient documentation

## 2016-05-27 NOTE — ED Provider Notes (Signed)
[facility]-EMERGENCY DEPT Provider Note   CSN: [id number] Arrival date & time: 05/27/16  1553  First Provider Contact:  First MD Initiated Contact with Patient 05/27/16 1722    By signing my name below, I, Freida Busman, attest that this documentation has been prepared under the direction and in the presence of non-physician practitioner, Audry Pili, PA-C. Electronically Signed: Freida Busman, Scribe. 05/27/2016. 5:32 PM.  History   Chief Complaint Chief Complaint  Patient presents with  . Wound Check    The history is provided by the patient. No language interpreter was used.   HPI Comments:  Christine Fletcher is a 55 y.o. female who presents to the Emergency Department for a wound check. Pt had an abscess to the left shin drained 4 days ago she was instructed to return for wound check. She notes 1/10 pain at the site. She denies fever. Pt has no acute complaints or symptoms at this time. She is currently taking doxycycline.   Past Medical History:  Diagnosis Date  . Depression   . High cholesterol   . SVT (supraventricular tachycardia) (HCC)   . Tachycardia     There are no active problems to display for this patient.   Past Surgical History:  Procedure Laterality Date  . ANKLE SURGERY    . EYE SURGERY     three surgeries    OB History    No data available     Home Medications    Prior to Admission medications   Medication Sig Start Date End Date Taking? Authorizing Provider  doxycycline (VIBRAMYCIN) 100 MG capsule Take 1 capsule (100 mg total) by mouth 2 (two) times daily. 05/24/16   Arthor Captain, PA-C  ibuprofen (ADVIL,MOTRIN) 200 MG tablet Take 600 mg by mouth every 6 (six) hours as needed for moderate pain.    Historical Provider, MD  omeprazole (PRILOSEC) 40 MG capsule Take 40 mg by mouth daily.    Historical Provider, MD  traMADol (ULTRAM) 50 MG tablet Take 1 tablet (50 mg total) by mouth every 6 (six) hours as needed. 05/24/16   Arthor Captain, PA-C   Family  History No family history on file.  Social History Social History  Substance Use Topics  . Smoking status: Never Smoker  . Smokeless tobacco: Not on file  . Alcohol use Yes     Comment: occasionally   Allergies   Macrobid [nitrofurantoin monohyd macro]   Review of Systems Review of Systems  Constitutional: Negative for fever.  Skin: Positive for wound.   Physical Exam Updated Vital Signs BP 132/65 (BP Location: Right Arm)   Pulse 84   Temp 98.6 F (37 C) (Oral)   Resp 18   Ht 5\' 3"  (1.6 m)   Wt 165 lb (74.8 kg)   SpO2 99%   BMI 29.23 kg/m   Physical Exam  Constitutional: She is oriented to person, place, and time. She appears well-developed and well-nourished. No distress.  HENT:  Head: Normocephalic and atraumatic.  Eyes: Conjunctivae are normal.  Cardiovascular: Normal rate.   Pulmonary/Chest: Effort normal.  Neurological: She is alert and oriented to person, place, and time.  Skin: Skin is warm and dry.  left anterior shin: 1 cm in diameter open wound from previous abscess Minimal erythema; non-infectious No red streaking;  minimal purulence Healing well   Psychiatric: She has a normal mood and affect.  Nursing note and vitals reviewed.  ED Treatments / Results  DIAGNOSTIC STUDIES:  Oxygen Saturation is 99% on RA, normal by  my interpretation.    COORDINATION OF CARE:  5:26 PM Discussed treatment plan with pt at bedside and pt agreed to plan.  Labs (all labs ordered are listed, but only abnormal results are displayed) Labs Reviewed - No data to display  EKG  EKG Interpretation None      Radiology No results found.  Procedures Procedures   Medications Ordered in ED Medications - No data to display   Initial Impression / Assessment and Plan / ED Course  I have reviewed the triage vital signs and the nursing notes.  Pertinent labs & imaging results that were available during my care of the patient were reviewed by me and considered in my  medical decision making (see chart for details).  Clinical Course   Final Clinical Impressions(s) / ED Diagnoses  Patient returns for check of recent incision and drainage. The region appears to be well-healing and infection appears to be resolving. Patient symptoms improved from prior visit. Afebrile and hemodynamically stable. Pt is instructed to continue with home care or antibiotics. Pt has a good understanding of return precautions and is safe for discharge at this time.  Final diagnoses:  Visit for wound check   New Prescriptions New Prescriptions   No medications on file   I personally performed the services described in this documentation, which was scribed in my presence. The recorded information has been reviewed and is accurate.    Audry Pili, PA-C 05/27/16 1737    Marily Memos, MD 05/28/16 [phone]

## 2016-05-27 NOTE — ED Triage Notes (Signed)
Patient states that she had wound on left leg lanced on Friday and was told to come back here for re-check.  Patient states pain is 1/10.

## 2016-05-27 NOTE — Discharge Instructions (Signed)
Please read and follow all provided instructions.  Your diagnoses today include:  1. Visit for wound check    Tests performed today include: Vital signs. See below for your results today.   Medications prescribed:  Take as prescribed   Home care instructions:  Follow any educational materials contained in this packet.  Follow-up instructions: Please follow-up with your primary care provider for further evaluation of symptoms and treatment   Return instructions:  Please return to the Emergency Department if you do not get better, if you get worse, or new symptoms OR  - Fever (temperature greater than 101.38F)  - Bleeding that does not stop with holding pressure to the area    -Severe pain (please note that you may be more sore the day after your accident)  - Chest Pain  - Difficulty breathing  - Severe nausea or vomiting  - Inability to tolerate food and liquids  - Passing out  - Skin becoming red around your wounds  - Change in mental status (confusion or lethargy)  - New numbness or weakness    Please return if you have any other emergent concerns.  Additional Information:  Your vital signs today were: BP 132/65 (BP Location: Right Arm)    Pulse 84    Temp 98.6 F (37 C) (Oral)    Resp 18    Ht 5\' 3"  (1.6 m)    Wt 74.8 kg    SpO2 99%    BMI 29.23 kg/m  If your blood pressure (BP) was elevated above 135/85 this visit, please have this repeated by your doctor within one month. ---------------

## 2016-05-29 LAB — AEROBIC/ANAEROBIC CULTURE (SURGICAL/DEEP WOUND)

## 2016-05-29 LAB — AEROBIC/ANAEROBIC CULTURE W GRAM STAIN (SURGICAL/DEEP WOUND)

## 2016-05-30 ENCOUNTER — Telehealth: Payer: Self-pay | Admitting: *Deleted

## 2016-05-30 NOTE — Telephone Encounter (Signed)
Post ED Visit - Positive Culture Follow-up  Culture report reviewed by antimicrobial stewardship pharmacist:  [x]  Enzo Bi, Pharm.D. []  Celedonio Miyamoto, Pharm.D., BCPS []  Garvin Fila, Pharm.D. []  Georgina Pillion, Pharm.D., BCPS []  [place], [place].D., BCPS, AAHIVP []  Estella Husk, Pharm.D., BCPS, AAHIVP []  Tennis Must, Pharm.D. []  Sherle Poe, [address].D.  Positive aerobic/anaerobic culture Treated with Doxycycline Hyclate, organism sensitive to the same and no further patient follow-up is required at this time.  Virl Axe [facility] 05/30/2016, 10:12 AM

## 2016-09-30 ENCOUNTER — Encounter (HOSPITAL_COMMUNITY): Payer: Self-pay | Admitting: Emergency Medicine

## 2016-09-30 ENCOUNTER — Emergency Department (HOSPITAL_COMMUNITY)
Admission: EM | Admit: 2016-09-30 | Discharge: 2016-09-30 | Disposition: A | Discharge status: Home / Self Care | Payer: BLUE CROSS/BLUE SHIELD | Attending: Emergency Medicine | Admitting: Emergency Medicine

## 2016-09-30 DIAGNOSIS — R1030 Lower abdominal pain, unspecified: Secondary | ICD-10-CM | POA: Diagnosis not present

## 2016-09-30 LAB — COMPREHENSIVE METABOLIC PANEL
ALBUMIN: 4.5 g/dL (ref 3.5–5.0)
ALT: 29 U/L (ref 14–54)
AST: 21 U/L (ref 15–41)
Alkaline Phosphatase: 44 U/L (ref 38–126)
Anion gap: 8 (ref 5–15)
BILIRUBIN TOTAL: 0.6 mg/dL (ref 0.3–1.2)
BUN: 13 mg/dL (ref 6–20)
CO2: 27 mmol/L (ref 22–32)
CREATININE: 0.8 mg/dL (ref 0.44–1.00)
Calcium: 9.4 mg/dL (ref 8.9–10.3)
Chloride: 104 mmol/L (ref 101–111)
GFR calc Af Amer: >60 mL/min (ref >60)
GLUCOSE: 96 mg/dL (ref 65–99)
Potassium: 3.8 mmol/L (ref 3.5–5.1)
Sodium: 139 mmol/L (ref 135–145)
TOTAL PROTEIN: 8.8 g/dL — AB (ref 6.5–8.1)

## 2016-09-30 LAB — URINALYSIS, ROUTINE W REFLEX MICROSCOPIC
GLUCOSE, UA: NEGATIVE mg/dL
Hgb urine dipstick: NEGATIVE
KETONES UR: NEGATIVE mg/dL
LEUKOCYTES UA: NEGATIVE
Nitrite: NEGATIVE
PROTEIN: NEGATIVE mg/dL
Specific Gravity, Urine: 1.022 (ref 1.005–1.030)
pH: 5.5 (ref 5.0–8.0)

## 2016-09-30 LAB — LIPASE, BLOOD: Lipase: 19 U/L (ref 11–51)

## 2016-09-30 LAB — CBC
HCT: 39.5 % (ref 36.0–46.0)
Hemoglobin: 13 g/dL (ref 12.0–15.0)
MCH: 30 pg (ref 26.0–34.0)
MCHC: 32.9 g/dL (ref 30.0–36.0)
MCV: 91.2 fL (ref 78.0–100.0)
PLATELETS: 292 10*3/uL (ref 150–400)
RBC: 4.33 MIL/uL (ref 3.87–5.11)
RDW: 13.5 % (ref 11.5–15.5)
WBC: 9 10*3/uL (ref 4.0–10.5)

## 2016-09-30 NOTE — ED Triage Notes (Signed)
Pt c/o R and L lower abdominal pain since Friday. Pt sts she thinks it is gas pain. Pt sts she has been having regular bowel movements. Pt sts pain is worse when walking. Pt A&Ox4 and ambulatory. Pt denies N/V/D.

## 2016-09-30 NOTE — Discharge Instructions (Signed)
Watch for increasing pain or change in her symptoms. Follow-up as needed.

## 2016-09-30 NOTE — ED Provider Notes (Signed)
[facility]-EMERGENCY DEPT Provider Note   CSN: [id number] Arrival date & time: 09/30/16  1339     History   Chief Complaint Chief Complaint  Patient presents with  . Abdominal Pain    HPI Christine Fletcher is a 55 y.o. female.  HPI Patient presents with lower abdominal to pelvic pain. Has had it for the last 3 days. No dysuria but states her urine has been darker. States that she has had the pain constantly for the last 3 days. It is in her suprapubic area. No fevers or chills. No nausea vomiting diarrhea. States it hurts when she walks but not with bowel movement. No vaginal bleeding or discharge. Denies any intercourse. Denies fevers or chills. Denies back pain. She states she feels a little bloated.   Past Medical History:  Diagnosis Date  . Depression   . High cholesterol   . SVT (supraventricular tachycardia) (HCC)   . Tachycardia     There are no active problems to display for this patient.   Past Surgical History:  Procedure Laterality Date  . ANKLE SURGERY    . EYE SURGERY     three surgeries    OB History    No data available       Home Medications    Prior to Admission medications   Medication Sig Start Date End Date Taking? Authorizing Provider  buPROPion (WELLBUTRIN SR) 150 MG 12 hr tablet Take 150 mg by mouth 2 (two) times daily. 07/05/16 07/05/17 Yes Historical Provider, MD  diclofenac (VOLTAREN) 75 MG EC tablet Take 75 mg by mouth 2 (two) times daily. 09/30/16  Yes Historical Provider, MD  FLUoxetine (PROZAC) 20 MG capsule Take 20 mg by mouth daily. 08/23/16 08/23/17 Yes Historical Provider, MD  metoprolol succinate (TOPROL-XL) 25 MG 24 hr tablet Take 25 mg by mouth daily. 09/30/16  Yes Historical Provider, MD  Multiple Vitamin (MULTIVITAMIN WITH MINERALS) TABS tablet Take 1 tablet by mouth daily.   Yes Historical Provider, MD  omeprazole (PRILOSEC) 40 MG capsule Take 40 mg by mouth daily.   Yes Historical Provider, MD  doxycycline (VIBRAMYCIN) 100 MG capsule  Take 1 capsule (100 mg total) by mouth 2 (two) times daily. Patient not taking: Reported on 09/30/2016 05/24/16   Arthor CaptainAbigail Harris, PA-C  traMADol (ULTRAM) 50 MG tablet Take 1 tablet (50 mg total) by mouth every 6 (six) hours as needed. Patient not taking: Reported on 09/30/2016 05/24/16   Arthor CaptainAbigail Harris, PA-C    Family History No family history on file.  Social History Social History  Substance Use Topics  . Smoking status: Never Smoker  . Smokeless tobacco: Not on file  . Alcohol use Yes     Comment: occasionally     Allergies   Macrobid [nitrofurantoin monohyd macro]   Review of Systems Review of Systems  Constitutional: Negative for activity change and appetite change.  HENT: Negative for congestion.   Eyes: Negative for pain.  Respiratory: Negative for chest tightness and shortness of breath.   Cardiovascular: Negative for chest pain and leg swelling.  Gastrointestinal: Positive for abdominal pain. Negative for diarrhea, nausea and vomiting.  Genitourinary: Negative for flank pain.  Musculoskeletal: Negative for back pain and neck stiffness.  Skin: Negative for rash.  Neurological: Negative for weakness, numbness and headaches.  Psychiatric/Behavioral: Negative for behavioral problems.     Physical Exam Updated Vital Signs BP (!) 113/50   Pulse 81   Temp 97.9 F (36.6 C) (Oral)   Resp 16   Ht  5' 2.5" (1.588 m)   Wt 170 lb (77.1 kg)   SpO2 99%   BMI 30.60 kg/m   Physical Exam  Constitutional: She appears well-developed.  HENT:  Head: Atraumatic.  Eyes: EOM are normal.  Neck: Neck supple.  Cardiovascular: Normal rate.   Pulmonary/Chest: Effort normal.  Abdominal: Soft. There is no tenderness.  Musculoskeletal: She exhibits no edema.  Neurological: She is alert.  Skin: Skin is warm. Capillary refill takes less than 2 seconds.  Psychiatric: She has a normal mood and affect.     ED Treatments / Results  Labs (all labs ordered are listed, but only  abnormal results are displayed) Labs Reviewed  COMPREHENSIVE METABOLIC PANEL - Abnormal; Notable for the following:       Result Value   Total Protein 8.8 (*)    All other components within normal limits  URINALYSIS, ROUTINE W REFLEX MICROSCOPIC (NOT AT [facility]) - Abnormal; Notable for the following:    Bilirubin Urine SMALL (*)    All other components within normal limits  LIPASE, BLOOD  CBC    EKG  EKG Interpretation None       Radiology No results found.  Procedures Procedures (including critical care time)  Medications Ordered in ED Medications - No data to display   Initial Impression / Assessment and Plan / ED Course  I have reviewed the triage vital signs and the nursing notes.  Pertinent labs & imaging results that were available during my care of the patient were reviewed by me and considered in my medical decision making (see chart for details).  Clinical Course   Patient presents with lower abdominal pain. Sent it for the last 2-3 days. Dull. No dysuria. No GI symptoms. Overall benign exam. No vaginal discharge. Patient denies sexual activity. No external perineal abnormality. Patient did later state that she has uterine prolapse. No uterus seen on superficial exam. Labs and urine reassuring. Will discharge home follow-up as needed. If symptoms continue may need further evaluation.  Final Clinical Impressions(s) / ED Diagnoses   Final diagnoses:  Lower abdominal pain    New Prescriptions New Prescriptions   No medications on file     Benjiman CoreNathan Nastassja Witkop, MD 09/30/16 2228

## 2016-09-30 NOTE — ED Notes (Signed)
Pt staes she is unable to urinate at this time

## 2016-09-30 NOTE — ED Notes (Signed)
Pt ambulated to restroom. 

## 2016-09-30 NOTE — ED Notes (Signed)
Requested urine

## 2017-03-31 ENCOUNTER — Encounter (HOSPITAL_COMMUNITY): Payer: Self-pay | Admitting: Emergency Medicine

## 2017-03-31 ENCOUNTER — Emergency Department (HOSPITAL_COMMUNITY): Payer: BLUE CROSS/BLUE SHIELD

## 2017-03-31 ENCOUNTER — Emergency Department (HOSPITAL_COMMUNITY)
Admission: EM | Admit: 2017-03-31 | Discharge: 2017-03-31 | Disposition: A | Discharge status: Home / Self Care | Payer: BLUE CROSS/BLUE SHIELD | Attending: Emergency Medicine | Admitting: Emergency Medicine

## 2017-03-31 DIAGNOSIS — Z79899 Other long term (current) drug therapy: Secondary | ICD-10-CM | POA: Insufficient documentation

## 2017-03-31 DIAGNOSIS — I471 Supraventricular tachycardia: Secondary | ICD-10-CM | POA: Insufficient documentation

## 2017-03-31 DIAGNOSIS — R000 Tachycardia, unspecified: Secondary | ICD-10-CM | POA: Diagnosis present

## 2017-03-31 LAB — BASIC METABOLIC PANEL
ANION GAP: 9 (ref 5–15)
BUN: 11 mg/dL (ref 6–20)
CHLORIDE: 107 mmol/L (ref 101–111)
CO2: 24 mmol/L (ref 22–32)
Calcium: 8.6 mg/dL — ABNORMAL LOW (ref 8.9–10.3)
Creatinine, Ser: 0.76 mg/dL (ref 0.44–1.00)
GFR calc Af Amer: >60 mL/min (ref >60)
GLUCOSE: 135 mg/dL — AB (ref 65–99)
POTASSIUM: 3.4 mmol/L — AB (ref 3.5–5.1)
Sodium: 140 mmol/L (ref 135–145)

## 2017-03-31 LAB — CBC
HEMATOCRIT: 42.7 % (ref 36.0–46.0)
HEMOGLOBIN: 14 g/dL (ref 12.0–15.0)
MCH: 29.6 pg (ref 26.0–34.0)
MCHC: 32.8 g/dL (ref 30.0–36.0)
MCV: 90.3 fL (ref 78.0–100.0)
Platelets: 384 10*3/uL (ref 150–400)
RBC: 4.73 MIL/uL (ref 3.87–5.11)
RDW: 13 % (ref 11.5–15.5)
WBC: 12.6 10*3/uL — AB (ref 4.0–10.5)

## 2017-03-31 LAB — MAGNESIUM: Magnesium: 2 mg/dL (ref 1.7–2.4)

## 2017-03-31 LAB — TSH: TSH: 1.347 u[IU]/mL (ref 0.350–4.500)

## 2017-03-31 MED ORDER — ADENOSINE 6 MG/2ML IV SOLN
6.0000 mg | Freq: Once | INTRAVENOUS | Status: AC
  Administered 2017-03-31: 6 mg via INTRAVENOUS
  Filled 2017-03-31: qty 2

## 2017-03-31 MED ORDER — ADENOSINE 6 MG/2ML IV SOLN
INTRAVENOUS | Status: AC
  Filled 2017-03-31: qty 2

## 2017-03-31 MED ORDER — METOPROLOL SUCCINATE ER 25 MG PO TB24: 25.0000 mg | ORAL_TABLET | Freq: Every day | ORAL | 0 refills | Status: DC

## 2017-03-31 MED ORDER — ADENOSINE 6 MG/2ML IV SOLN
INTRAVENOUS | Status: AC
  Filled 2017-03-31: qty 4

## 2017-03-31 MED ORDER — METOPROLOL TARTRATE 25 MG PO TABS
12.5000 mg | ORAL_TABLET | Freq: Once | ORAL | Status: AC
  Administered 2017-03-31: 12.5 mg via ORAL
  Filled 2017-03-31: qty 1

## 2017-03-31 MED ORDER — SODIUM CHLORIDE 0.9 % IV BOLUS (SEPSIS)
1000.0000 mL | Freq: Once | INTRAVENOUS | Status: AC
  Administered 2017-03-31: 1000 mL via INTRAVENOUS

## 2017-03-31 NOTE — ED Notes (Signed)
ED Provider at bedside. 

## 2017-03-31 NOTE — ED Provider Notes (Signed)
[facility]ER[facility] DEPT Provider Note   CSN: [id number] Arrival date & time: 03/31/17  1132     History   Chief Complaint Chief Complaint  Patient presents with  . Tachycardia    HPI Christine Fletcher is a 56 y.o. female.  HPI   56 year old female with past medical history of recurrent SVT here with tachycardia. The patient states that she has been treated multiple times for this. Over the last hour, she noticed acute onset of palpitations. She tried to breathe very deeply and blow into her hand to convert herself with no improvement. She has had associated shortness of breath and palpitations. No chest pain. She denies any recent medication changes. She states she has been taking her atenolol as prescribed, although she did go several days without it due to difficulty obtaining her medications. No heavy caffeine use. No alcohol use. No other medical complaints.  Past Medical History:  Diagnosis Date  . Depression   . High cholesterol   . SVT (supraventricular tachycardia) (HCC)   . Tachycardia     There are no active problems to display for this patient.   Past Surgical History:  Procedure Laterality Date  . ANKLE SURGERY    . EYE SURGERY     three surgeries    OB History    No data available       Home Medications    Prior to Admission medications   Medication Sig Start Date End Date Taking? Authorizing Provider  buPROPion (WELLBUTRIN SR) 150 MG 12 hr tablet Take 150 mg by mouth 2 (two) times daily. 07/05/16 07/05/17  [provider]  diclofenac (VOLTAREN) 75 MG EC tablet Take 75 mg by mouth 2 (two) times daily. 09/30/16   [provider]  doxycycline (VIBRAMYCIN) 100 MG capsule Take 1 capsule (100 mg total) by mouth 2 (two) times daily. Patient not taking: Reported on 09/30/2016 05/24/16   Arthor Captain, PA-C  FLUoxetine (PROZAC) 20 MG capsule Take 20 mg by mouth daily. 08/23/16 08/23/17  [provider]  metoprolol succinate (TOPROL-XL) 25 MG  24 hr tablet Take 1 tablet (25 mg total) by mouth daily. 03/31/17   Shaune Pollack, MD  Multiple Vitamin (MULTIVITAMIN WITH MINERALS) TABS tablet Take 1 tablet by mouth daily.    [provider]  omeprazole (PRILOSEC) 40 MG capsule Take 40 mg by mouth daily.    [provider]  traMADol (ULTRAM) 50 MG tablet Take 1 tablet (50 mg total) by mouth every 6 (six) hours as needed. Patient not taking: Reported on 09/30/2016 05/24/16   Arthor Captain, PA-C    Family History History reviewed. No pertinent family history.  Social History Social History  Substance Use Topics  . Smoking status: Never Smoker  . Smokeless tobacco: Not on file  . Alcohol use Yes     Comment: occasionally     Allergies   Macrobid [nitrofurantoin monohyd macro]   Review of Systems Review of Systems  Constitutional: Positive for fatigue.  Respiratory: Positive for shortness of breath.   Cardiovascular: Positive for palpitations.  Neurological: Positive for weakness.  All other systems reviewed and are negative.    Physical Exam Updated Vital Signs BP 108/72 (BP Location: Left Arm)   Pulse (!) 109   Resp 16   Ht 5\' 2"  (1.575 m)   Wt 81.6 kg (180 lb)   SpO2 100%   BMI 32.92 kg/m   Physical Exam  Constitutional: She is oriented to person, place, and time. She appears well-developed  and well-nourished. She appears distressed.  HENT:  Head: Normocephalic and atraumatic.  Eyes: Conjunctivae are normal.  Neck: Neck supple.  Cardiovascular: Regular rhythm and normal heart sounds.  Tachycardia present.  Exam reveals no friction rub.   No murmur heard. Pulmonary/Chest: Effort normal and breath sounds normal. No respiratory distress. She has no wheezes. She has no rales.  Abdominal: She exhibits no distension.  Musculoskeletal: She exhibits no edema.  Neurological: She is alert and oriented to person, place, and time. She exhibits normal muscle tone.  Skin: Skin is warm. Capillary refill  takes less than 2 seconds.  Psychiatric: She has a normal mood and affect.  Nursing note and vitals reviewed.    ED Treatments / Results  Labs (all labs ordered are listed, but only abnormal results are displayed) Labs Reviewed  CBC - Abnormal; Notable for the following:       Result Value   WBC 12.6 (*)    All other components within normal limits  BASIC METABOLIC PANEL - Abnormal; Notable for the following:    Potassium 3.4 (*)    Glucose, Bld 135 (*)    Calcium 8.6 (*)    All other components within normal limits  MAGNESIUM  TSH    EKG  EKG Interpretation  Date/Time:  Monday Mar 31 2017 14:15:53 EDT Ventricular Rate:  95 PR Interval:    QRS Duration: 99 QT Interval:  376 QTC Calculation: 473 R Axis:   -108 Text Interpretation:  Since last EKG,  Sinus rhythm has replaced SVT No ST elevations or depressions Confirmed by Shaune PollackIsaacs, Saya Mccoll [phone] on 03/31/2017 4:29:44 PM       Radiology Dg Chest 2 View  Result Date: 03/31/2017 CLINICAL DATA:  Tachycardia EXAM: CHEST  2 VIEW COMPARISON:  02/22/2008 FINDINGS: The heart size and mediastinal contours are within normal limits. Both lungs are clear. The visualized skeletal structures are unremarkable. IMPRESSION: No active cardiopulmonary disease. Electronically Signed   By: Alcide CleverMark  Lukens M.D.   On: 03/31/2017 13:08    Procedures .Critical Care Performed by: Shaune PollackISAACS, Codie Hainer Authorized by: Shaune PollackISAACS, Aiyla Baucom   Critical care provider statement:    Critical care time (minutes):  35   Critical care time was exclusive of:  Separately billable procedures and treating other patients   Critical care was necessary to treat or prevent imminent or life-threatening deterioration of the following conditions:  Cardiac failure and circulatory failure   Critical care was time spent personally by me on the following activities:  Development of treatment plan with patient or surrogate, evaluation of patient's response to treatment, examination  of patient, obtaining history from patient or surrogate, ordering and performing treatments and interventions, ordering and review of laboratory studies, ordering and review of radiographic studies, pulse oximetry, re-evaluation of patient's condition and review of old charts   (including critical care time)  Medications Ordered in ED Medications  sodium chloride 0.9 % bolus 1,000 mL (0 mLs Intravenous Stopped 03/31/17 1356)  adenosine (ADENOCARD) 6 MG/2ML injection 6 mg (6 mg Intravenous See Procedure Record 03/31/17 1207)  metoprolol tartrate (LOPRESSOR) tablet 12.5 mg (12.5 mg Oral Given 03/31/17 1411)     Initial Impression / Assessment and Plan / ED Course  I have reviewed the triage vital signs and the nursing notes.  Pertinent labs & imaging results that were available during my care of the patient were reviewed by me and considered in my medical decision making (see chart for details).    56 yo F with h/o  recurrent SVT here with acute onset palpitations. On arrival, HR 200s, EKG c/w acute SVT. Reviewed prior EKGs, ED visits - pt seems to have recurrent SVT requiring adenosine, has not seen cards. Responds well to adenosine with no delta waves or signs of WPW on old EKGs per my review. Vagal maneuvers attempted without success.  Pt subsequently given adenosine 6 mg, 6 mg, and 12 mg with ultimate resolution of SVT. Repeat EKG is c/w NSR/sinus tachycardia. Her labs are o/w largely unremarkable with exception of mild hypoK.  Discussed importance of following up with Cards. She is out of her atenolol and is having difficulty with obtaining this. Will give her metoprolol (she has used this previously), refer for cards f/u. Pt ambulatory w/o difficulty, remains in NSR.  Final Clinical Impressions(s) / ED Diagnoses   Final diagnoses:  SVT (supraventricular tachycardia) [facility])    New Prescriptions Discharge Medication List as of 03/31/2017  2:08 PM       Shaune Pollack, MD 04/01/17  [phone]

## 2017-03-31 NOTE — ED Notes (Signed)
Total of 24mg  adenosine given per verbal order from Dr. Erma HeritageIsaacs. 1st dose 6mg , 2nd dose 6mg , 3rd dose 24 mg. Pt was alert and oriented throughout administration.  Pt remains on monitor with defibrillation pads present as well.

## 2017-03-31 NOTE — ED Notes (Signed)
Brought pt straight back to room with complaint of chest pain onset 45 minutes ago; heart rate noted to be 228 on monitor; hx of SVT. Issacs MD at bedside. Kerman Passeyachel Keslar charge RN and Mariea ClontsJulie Edwards NT at bedside inserting IV and collected EKG.

## 2017-03-31 NOTE — ED Triage Notes (Signed)
Per pt, she has a hx of high heart rate and has had to get adenosine in the past. Pt is alert and oriented x 4.  ED provider at bedside.

## 2017-10-09 ENCOUNTER — Emergency Department (HOSPITAL_COMMUNITY)
Admission: EM | Admit: 2017-10-09 | Discharge: 2017-10-09 | Disposition: A | Discharge status: Home / Self Care | Payer: BLUE CROSS/BLUE SHIELD | Attending: Emergency Medicine | Admitting: Emergency Medicine

## 2017-10-09 ENCOUNTER — Encounter (HOSPITAL_COMMUNITY): Payer: Self-pay

## 2017-10-09 ENCOUNTER — Emergency Department (HOSPITAL_COMMUNITY): Payer: BLUE CROSS/BLUE SHIELD

## 2017-10-09 DIAGNOSIS — R0789 Other chest pain: Secondary | ICD-10-CM | POA: Insufficient documentation

## 2017-10-09 DIAGNOSIS — Z79899 Other long term (current) drug therapy: Secondary | ICD-10-CM | POA: Insufficient documentation

## 2017-10-09 DIAGNOSIS — I471 Supraventricular tachycardia: Secondary | ICD-10-CM | POA: Insufficient documentation

## 2017-10-09 DIAGNOSIS — Z7982 Long term (current) use of aspirin: Secondary | ICD-10-CM | POA: Diagnosis not present

## 2017-10-09 DIAGNOSIS — R002 Palpitations: Secondary | ICD-10-CM | POA: Diagnosis present

## 2017-10-09 LAB — CBC
HEMATOCRIT: 40.9 % (ref 36.0–46.0)
HEMOGLOBIN: 13.4 g/dL (ref 12.0–15.0)
MCH: 29.5 pg (ref 26.0–34.0)
MCHC: 32.8 g/dL (ref 30.0–36.0)
MCV: 90.1 fL (ref 78.0–100.0)
Platelets: 322 10*3/uL (ref 150–400)
RBC: 4.54 MIL/uL (ref 3.87–5.11)
RDW: 13.9 % (ref 11.5–15.5)
WBC: 10.5 10*3/uL (ref 4.0–10.5)

## 2017-10-09 LAB — BASIC METABOLIC PANEL
ANION GAP: 9 (ref 5–15)
BUN: 18 mg/dL (ref 6–20)
CO2: 23 mmol/L (ref 22–32)
Calcium: 9.2 mg/dL (ref 8.9–10.3)
Chloride: 105 mmol/L (ref 101–111)
Creatinine, Ser: 1.22 mg/dL — ABNORMAL HIGH (ref 0.44–1.00)
GFR calc Af Amer: 55 mL/min — ABNORMAL LOW (ref >60)
GFR calc non Af Amer: 48 mL/min — ABNORMAL LOW (ref >60)
GLUCOSE: 158 mg/dL — AB (ref 65–99)
POTASSIUM: 4 mmol/L (ref 3.5–5.1)
Sodium: 137 mmol/L (ref 135–145)

## 2017-10-09 LAB — I-STAT BETA HCG BLOOD, ED (MC, WL, AP ONLY): I-stat hCG, quantitative: <5 m[IU]/mL (ref <5)

## 2017-10-09 LAB — I-STAT TROPONIN, ED
TROPONIN I, POC: 0.02 ng/mL (ref 0.00–0.08)
Troponin i, poc: 0 ng/mL (ref 0.00–0.08)

## 2017-10-09 NOTE — ED Provider Notes (Signed)
[facility] Provider [facility]   CSN: [id number] Arrival date & time: 10/09/17  0500     History   Chief Complaint Chief Complaint  Patient presents with  . Chest Pain    HPI Arvilla MarketDenise Ritter is a 56 y.o. female.  HPI  Patient presents with concern of recurrent palpitations, chest tightness. She notes a history of SVT, has not seen a cardiologist, but has her symptoms managed with her primary care physician. Twice over the past day she has had episodes of nonsustained palpitations, chest tightness, diaphoresis. Currently states that she feels generally well, denies pain, lightheadedness, diaphoresis. After the most recent episode she had some lingering chest pain, and decided to be evaluated. She denies recent medication changes, diet changes. When asked whether she has seen a cardiologist, she replies that it has been suggested, but she has not done so yet.   Past Medical History:  Diagnosis Date  . Depression   . High cholesterol   . SVT (supraventricular tachycardia) (HCC)   . Tachycardia     There are no active problems to display for this patient.   Past Surgical History:  Procedure Laterality Date  . ANKLE SURGERY    . EYE SURGERY     three surgeries    OB History    No data available       Home Medications    Prior to Admission medications   Medication Sig Start Date End Date Taking? Authorizing Provider  aspirin 325 MG tablet Take 325 mg by mouth every 6 (six) hours as needed for mild pain.   Yes [provider]  buPROPion (WELLBUTRIN XL) 300 MG 24 hr tablet Take 300 mg by mouth daily. 08/14/17  Yes [provider]  Dextromethorphan-Guaifenesin (ROBITUSSIN COUGH/CHEST DM MAX) 10-200 MG/5ML LIQD Take 15 mLs by mouth every 6 (six) hours as needed (cough).   Yes [provider]  diclofenac (VOLTAREN) 75 MG EC tablet Take 75 mg by mouth 2 (two) times daily. 09/30/16  Yes [provider]   diphenhydrAMINE (BENADRYL) 25 MG tablet Take 25 mg by mouth at bedtime.   Yes [provider]  FLUoxetine (PROZAC) 20 MG tablet Take 20 mg by mouth daily.   Yes [provider]  loratadine (CLARITIN) 10 MG tablet Take 10 mg by mouth daily.   Yes [provider]  metoprolol succinate (TOPROL-XL) 25 MG 24 hr tablet Take 1 tablet (25 mg total) by mouth daily. 03/31/17  Yes Shaune PollackIsaacs, Cameron, MD  Multiple Vitamin (MULTIVITAMIN WITH MINERALS) TABS tablet Take 1 tablet by mouth daily.   Yes [provider]  Omega-3 Fatty Acids (FISH OIL PO) Take 1 capsule by mouth daily.   Yes [provider]  omeprazole (PRILOSEC) 40 MG capsule Take 40 mg by mouth daily.   Yes [provider]  triamcinolone ointment (KENALOG) 0.1 % Apply 1 application topically 2 (two) times daily.   Yes [provider]  vitamin B-12 (CYANOCOBALAMIN) 100 MCG tablet Take 100 mcg by mouth daily.   Yes [provider]  doxycycline (VIBRAMYCIN) 100 MG capsule Take 1 capsule (100 mg total) by mouth 2 (two) times daily. Patient not taking: Reported on 09/30/2016 05/24/16   Arthor CaptainHarris, Abigail, PA-C  traMADol (ULTRAM) 50 MG tablet Take 1 tablet (50 mg total) by mouth every 6 (six) hours as needed. Patient not taking: Reported on 09/30/2016 05/24/16   Arthor CaptainHarris, Abigail, PA-C    Family History No family history on file.  Social History Social History   Tobacco Use  . Smoking status: Never Smoker  . Smokeless tobacco: Never Used  Substance Use Topics  . Alcohol use: Yes    Comment: occasionally  . Drug use: No     Allergies   Macrobid [nitrofurantoin monohyd macro]   Review of Systems Review of Systems  Constitutional:       Per HPI, otherwise negative  HENT:       Per HPI, otherwise negative  Respiratory:       Per HPI, otherwise negative  Cardiovascular:       Per HPI, otherwise negative  Gastrointestinal: Negative for vomiting.  Endocrine:        Negative aside from HPI  Genitourinary:       Neg aside from HPI   Musculoskeletal:       Per HPI, otherwise negative  Skin: Negative.   Neurological: Negative for syncope.     Physical Exam Updated Vital Signs BP 107/64 (BP Location: Right Arm)   Pulse 76   Temp 98.1 F (36.7 C) (Oral)   Resp 16   SpO2 97%   Physical Exam  Constitutional: She is oriented to person, place, and time. She appears well-developed and well-nourished. No distress.  HENT:  Head: Normocephalic and atraumatic.  Eyes: Conjunctivae and EOM are normal.  Cardiovascular: Normal rate and regular rhythm.  Pulmonary/Chest: Effort normal and breath sounds normal. No stridor. No respiratory distress.  Abdominal: She exhibits no distension.  Musculoskeletal: She exhibits no edema.  Neurological: She is alert and oriented to person, place, and time. No cranial nerve deficit.  Skin: Skin is warm and dry.  Psychiatric: She has a normal mood and affect.  Nursing note and vitals reviewed.    ED Treatments / Results  Labs (all labs ordered are listed, but only abnormal results are displayed) Labs Reviewed  BASIC METABOLIC PANEL - Abnormal; Notable for the following components:      Result Value   Glucose, Bld 158 (*)    Creatinine, Ser 1.22 (*)    GFR calc non Af Amer 48 (*)    GFR calc Af Amer 55 (*)    All other components within normal limits  CBC  I-STAT TROPONIN, ED  I-STAT BETA HCG BLOOD, ED ([facility], [facility], [facility] ONLY)  I-STAT TROPONIN, ED    EKG  EKG Interpretation  Date/Time:  Thursday October 09 2017 05:05:16 EST Ventricular Rate:  104 PR Interval:  154 QRS Duration: 90 QT Interval:  360 QTC Calculation: 473 R Axis:   151 Text Interpretation:  Sinus tachycardia Right axis deviation Abnormal ekg Confirmed by Gerhard Munch [phone]) on 10/09/2017 8:50:34 AM       Radiology Dg Chest 2 View  Result Date: 10/09/2017 CLINICAL DATA:  56 year old female with chest pain. EXAM: CHEST  2 VIEW  COMPARISON:  Chest radiograph dated 03/31/2017 FINDINGS: The lungs are clear. There is no pleural effusion or pneumothorax. The cardiac silhouette is within normal limits. There is a moderate size hiatal hernia. No acute osseous pathology. IMPRESSION: 1. No acute cardiopulmonary process. 2. Hiatal hernia containing air-fluid levels. Electronically Signed   By: Elgie Collard M.D.   On: 10/09/2017 05:39    Procedures Procedures (including critical care time)  Medications Ordered in ED Medications - No data to display   Initial Impression / Assessment and Plan / ED Course  I have reviewed the triage vital signs and the nursing notes.  Pertinent labs & imaging results that were available  during my care of the patient were reviewed by me and considered in my medical decision making (see chart for details).  Patient presents after an episode of palpitations, possible SVT.  On here the patient was monitored for several hours with no recurrence of the arrhythmia. Patient notes that she has not followed up with cardiology, was advised to do so. With reassuring results here, 2- troponin, unremarkable EKG, there is low suspicion for ongoing ACS. No evidence for other acute new pathology, patient discharged in stable condition.  Final Clinical Impressions(s) / ED Diagnoses   Final diagnoses:  Atypical chest pain     Gerhard MunchLockwood, Nyasiah Moffet, MD 10/09/17 1047

## 2017-10-09 NOTE — Discharge Instructions (Signed)
As discussed, your evaluation today has been largely reassuring.  But, it is important that you monitor your condition carefully, and do not hesitate to return to the ED if you develop new, or concerning changes in your condition. ? ?Otherwise, please follow-up with your physician for appropriate ongoing care. ? ?

## 2017-10-09 NOTE — ED Notes (Signed)
Pt ambulated to room from waiting without difficulty. Pt denies any pain at this time. Pt ambulated to restroom when arrived to room.

## 2017-10-09 NOTE — ED Triage Notes (Signed)
Pt states that she has a hx os SVT and believes she had two spells of it tonight lasting 20 minutes each, afterwards had CP which has not resolved.

## 2017-11-25 ENCOUNTER — Encounter: Payer: Self-pay | Admitting: Cardiovascular Disease

## 2017-12-05 ENCOUNTER — Ambulatory Visit: Payer: BLUE CROSS/BLUE SHIELD | Admitting: Cardiovascular Disease

## 2017-12-30 ENCOUNTER — Encounter: Payer: Self-pay | Admitting: Cardiovascular Disease

## 2017-12-30 ENCOUNTER — Ambulatory Visit: Payer: BLUE CROSS/BLUE SHIELD | Admitting: Cardiovascular Disease

## 2017-12-30 VITALS — BP 124/86 | HR 69 | Ht 62.5 in | Wt 184.0 lb

## 2017-12-30 DIAGNOSIS — I471 Supraventricular tachycardia: Secondary | ICD-10-CM | POA: Insufficient documentation

## 2017-12-30 NOTE — Addendum Note (Signed)
Addended by: Evans LanceSTOVER, Raquel Racey W on: 12/30/2017 03:50 PM   Modules accepted: Orders

## 2017-12-30 NOTE — Assessment & Plan Note (Signed)
Ms. Christine Fletcher was referred to me by Dr. Staci AcostaLarry Fletcher for evaluation of PSVT. She's had episodes for several years occurring quarterly. The episodes have been controllable with Valsalva maneuvers as well as IV adenosine although Valsalva has become less effective over the years. She's also admitted to drinking alcohol on a binge basis with some correlation with her SVT and alcohol intake. When she is in her SVT she does complain some chest heaviness and has had some presyncope but never true syncope. I'm going to get a 2-D echocardiogram and exercise Myoview stress test and refer her to Dr. Elberta Fortisamnitz for EP evaluation.

## 2017-12-30 NOTE — Progress Notes (Signed)
12/30/2017 Christine Fletcher   03/26/1961  [id number]  Primary Physician Hollar, Marrian SalvageLarry A, MD Primary Cardiologist: Runell GessJonathan J Lativia Velie MD Nicholes CalamityFACP, FACC, FAHA, [place]  HPI:  Christine MarketDenise Fletcher is a 57 y.o. mildly overweight widowed Caucasian female children who was referred to me by Dr. Staci AcostaLarry Holler for cardiovascular evaluation because of symptomatic PSVT. 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 She has no cardiac risk factors. She does admit to binge drinking alcohol up until 2 weeks ago. She's had episodes of PSVT for several years occurring quarterly usually controllable with Valsalva maneuver on occasion requiring adenosine IV. During this episode she does get chest heaviness and occasionally feels like she is going to black out.    Current Meds  Medication Sig  . aspirin 325 MG tablet Take 325 mg by mouth every 6 (six) hours as needed for mild pain.  Marland Kitchen. buPROPion (WELLBUTRIN XL) 300 MG 24 hr tablet Take 300 mg by mouth daily.  . diclofenac (VOLTAREN) 75 MG EC tablet Take 75 mg by mouth 2 (two) times daily.  . diphenhydrAMINE (BENADRYL) 25 MG tablet Take 25 mg by mouth at bedtime.  Marland Kitchen. FLUoxetine (PROZAC) 20 MG tablet Take 20 mg by mouth daily.  Marland Kitchen. loratadine (CLARITIN) 10 MG tablet Take 10 mg by mouth daily.  . metoprolol succinate (TOPROL-XL) 25 MG 24 hr tablet Take 1 tablet (25 mg total) by mouth daily.  . Multiple Vitamin (MULTIVITAMIN WITH MINERALS) TABS tablet Take 1 tablet by mouth daily.  . Omega-3 Fatty Acids (FISH OIL PO) Take 1 capsule by mouth daily.  Marland Kitchen. omeprazole (PRILOSEC) 40 MG capsule Take 40 mg by mouth daily.  Marland Kitchen. triamcinolone ointment (KENALOG) 0.1 % Apply 1 application topically 2 (two) times daily.  . vitamin B-12 (CYANOCOBALAMIN) 100 MCG tablet Take 100 mcg by mouth daily.     Allergies  Allergen Reactions  . Macrobid [Nitrofurantoin Monohyd Macro] Itching    Social History   Socioeconomic History  . Marital status: Unknown    Spouse name: Not  on file  . Number of children: Not on file  . Years of education: Not on file  . Highest education level: Not on file  Social Needs  . Financial resource strain: Not on file  . Food insecurity - worry: Not on file  . Food insecurity - inability: Not on file  . Transportation needs - medical: Not on file  . Transportation needs - non-medical: Not on file  Occupational History  . Not on file  Tobacco Use  . Smoking status: Never Smoker  . Smokeless tobacco: Never Used  Substance and Sexual Activity  . Alcohol use: Yes    Comment: occasionally  . Drug use: No  . Sexual activity: Not on file  Other Topics Concern  . Not on file  Social History Narrative  . Not on file     Review of Systems: General: negative for chills, fever, night sweats or weight changes.  Cardiovascular: negative for chest pain, dyspnea on exertion, edema, orthopnea, palpitations, paroxysmal nocturnal dyspnea or shortness of breath Dermatological: negative for rash Respiratory: negative for cough or wheezing Urologic: negative for hematuria Abdominal: negative for nausea, vomiting, diarrhea, bright red blood per rectum, melena, or hematemesis Neurologic: negative for visual changes, syncope, or dizziness All other systems reviewed and are otherwise negative except as noted above.    Blood pressure 124/86, pulse 69, height 5' 2.5" (1.588 m), weight 184 lb (83.5 kg).  General appearance:  alert and no distress Neck: no adenopathy, no carotid bruit, no JVD, supple, symmetrical, trachea midline and thyroid not enlarged, symmetric, no tenderness/mass/nodules Lungs: clear to auscultation bilaterally Heart: regular rate and rhythm, S1, S2 normal, no murmur, click, rub or gallop Extremities: extremities normal, atraumatic, no cyanosis or edema Pulses: 2+ and symmetric Skin: Skin color, texture, turgor normal. No rashes or lesions Neurologic: Alert and oriented X 3, normal strength and tone. Normal symmetric  reflexes. Normal coordination and gait  EKG sinus rhythm at 69 with RSR prime in leads V1 and V2 consistent with an RV conduction delay. I personally reviewed this EKG.  ASSESSMENT AND PLAN:   PSVT (paroxysmal supraventricular tachycardia) (HCC) Christine Fletcher was referred to me by Dr. Staci Acosta for evaluation of PSVT. She's had episodes for several years occurring quarterly. The episodes have been controllable with Valsalva maneuvers as well as IV adenosine although Valsalva has become less effective over the years. She's also admitted to drinking alcohol on a binge basis with some correlation with her SVT and alcohol intake. When she is in her SVT she does complain some chest heaviness and has had some presyncope but never true syncope. I'm going to get a 2-D echocardiogram and exercise Myoview stress test and refer her to Dr. Elberta Fortis for EP evaluation.      Runell Gess MD FACP,FACC,FAHA, [facility] 12/30/2017 3:44 PM

## 2017-12-30 NOTE — Patient Instructions (Addendum)
Medication Instructions: Your physician recommends that you continue on your current medications as directed. Please refer to the Current Medication list given to you today.   Testing/Procedures: Your physician has requested that you have an echocardiogram. Echocardiography is a painless test that uses sound waves to create images of your heart. It provides your doctor with information about the size and shape of your heart and how well your heart's chambers and valves are working. This procedure takes approximately one hour. There are no restrictions for this procedure.  Your physician has requested that you have an exercise stress myoview. For further information please visit [email or web address]. Please follow instruction sheet, as given.   Follow-Up: You have been referred to Dr. Elberta Fortisamnitz.  Your physician recommends that you schedule a follow-up appointment in: 3 months with Dr. Allyson SabalBerry.

## 2018-01-07 ENCOUNTER — Ambulatory Visit (HOSPITAL_COMMUNITY): Payer: BLUE CROSS/BLUE SHIELD | Attending: Cardiology

## 2018-01-07 ENCOUNTER — Other Ambulatory Visit: Payer: Self-pay

## 2018-01-07 DIAGNOSIS — I071 Rheumatic tricuspid insufficiency: Secondary | ICD-10-CM | POA: Insufficient documentation

## 2018-01-07 DIAGNOSIS — R079 Chest pain, unspecified: Secondary | ICD-10-CM | POA: Insufficient documentation

## 2018-01-07 DIAGNOSIS — I471 Supraventricular tachycardia: Secondary | ICD-10-CM | POA: Diagnosis not present

## 2018-01-13 ENCOUNTER — Telehealth (HOSPITAL_COMMUNITY): Payer: Self-pay

## 2018-01-13 NOTE — Telephone Encounter (Signed)
Encounter complete. 

## 2018-01-15 ENCOUNTER — Encounter: Payer: Self-pay | Admitting: Cardiovascular Disease

## 2018-01-15 ENCOUNTER — Ambulatory Visit (HOSPITAL_COMMUNITY)
Admission: RE | Admit: 2018-01-15 | Discharge: 2018-01-15 | Disposition: A | Discharge status: Home / Self Care | Payer: BLUE CROSS/BLUE SHIELD | Source: Ambulatory Visit | Attending: Cardiovascular Disease | Admitting: Cardiovascular Disease

## 2018-01-15 DIAGNOSIS — I471 Supraventricular tachycardia: Secondary | ICD-10-CM

## 2018-01-20 ENCOUNTER — Telehealth (HOSPITAL_COMMUNITY): Payer: Self-pay

## 2018-01-20 NOTE — Telephone Encounter (Signed)
Unable to reach patient. I will attempt again at a later time.  Encounter complete.

## 2018-01-22 ENCOUNTER — Ambulatory Visit (HOSPITAL_COMMUNITY)
Admission: RE | Admit: 2018-01-22 | Discharge: 2018-01-22 | Disposition: A | Discharge status: Home / Self Care | Payer: BLUE CROSS/BLUE SHIELD | Source: Ambulatory Visit | Attending: Cardiovascular Disease | Admitting: Cardiovascular Disease

## 2018-01-22 DIAGNOSIS — I471 Supraventricular tachycardia: Secondary | ICD-10-CM | POA: Diagnosis not present

## 2018-01-22 LAB — MYOCARDIAL PERFUSION IMAGING
CHL CUP NUCLEAR SDS: 8
CHL CUP NUCLEAR SRS: 9
CSEPPHR: 88 {beats}/min
LV dias vol: 72 mL (ref 46–106)
LV sys vol: 20 mL
Rest HR: 57 {beats}/min
SSS: 15
TID: 1.25

## 2018-01-22 MED ORDER — REGADENOSON 0.4 MG/5ML IV SOLN
0.4000 mg | Freq: Once | INTRAVENOUS | Status: AC
  Administered 2018-01-22: 0.4 mg via INTRAVENOUS

## 2018-01-22 MED ORDER — TECHNETIUM TC 99M TETROFOSMIN IV KIT
30.5000 | PACK | Freq: Once | INTRAVENOUS | Status: AC | PRN
Start: 2018-01-22 — End: 2018-01-22
  Administered 2018-01-22: 30.5 via INTRAVENOUS
  Filled 2018-01-22: qty 31

## 2018-01-22 MED ORDER — TECHNETIUM TC 99M TETROFOSMIN IV KIT
9.8000 | PACK | Freq: Once | INTRAVENOUS | Status: AC | PRN
  Administered 2018-01-22: 9.8 via INTRAVENOUS
  Filled 2018-01-22: qty 10

## 2018-02-24 ENCOUNTER — Ambulatory Visit (INDEPENDENT_AMBULATORY_CARE_PROVIDER_SITE_OTHER): Payer: BLUE CROSS/BLUE SHIELD | Admitting: Cardiology

## 2018-02-24 ENCOUNTER — Encounter: Payer: Self-pay | Admitting: Cardiology

## 2018-02-24 VITALS — BP 124/80 | HR 88 | Ht 62.0 in | Wt 184.0 lb

## 2018-02-24 DIAGNOSIS — I471 Supraventricular tachycardia: Secondary | ICD-10-CM

## 2018-02-24 MED ORDER — ASPIRIN EC 81 MG PO TBEC: 81.0000 mg | DELAYED_RELEASE_TABLET | Freq: Every day | ORAL | 3 refills | Status: AC

## 2018-02-24 NOTE — Progress Notes (Signed)
Kreg ShropshirePrice, Donna M  Sai Moura L, RN        Appt made with Dr Saintclair HalstedSean Patrick Whalen on 03-25-18 at 9:30. Medical records will fax notes and CD of echo.

## 2018-02-24 NOTE — Progress Notes (Signed)
Electrophysiology Office Note   Date:  02/24/2018   ID:  Christine MarketDenise Hulse, DOB 1961/03/21, MRN [id number]  PCP:  Jerlyn LyHollar, Larry A, MD  Cardiologist:  Allyson SabalBerry Primary Electrophysiologist:  Will Jorja LoaMartin Camnitz, MD    Chief Complaint  Patient presents with  . Advice Only    PSVT     History of Present Illness: Christine Fletcher is a 57 y.o. female who is being seen today for the evaluation of SVT at the request of Nanetta BattyJonathan Berry. Presenting today for electrophysiology evaluation.  She has a history of depression, hyperlipidemia, and SVT.  She has had episodes of SVT for several years usually controlled by Valsalva.  She has required adenosine at times.  She has chest heaviness and near syncope during these episodes.  Today, she denies symptoms of chest pain, shortness of breath, orthopnea, PND, lower extremity edema, claudication, dizziness, presyncope, syncope, bleeding, or neurologic sequela. The patient is tolerating medications without difficulties.    Past Medical History:  Diagnosis Date  . Depression   . High cholesterol   . SVT (supraventricular tachycardia) (HCC)   . Tachycardia    Past Surgical History:  Procedure Laterality Date  . ANKLE SURGERY    . EYE SURGERY     three surgeries     Current Outpatient Medications  Medication Sig Dispense Refill  . buPROPion (WELLBUTRIN XL) 300 MG 24 hr tablet Take 300 mg by mouth daily.  0  . diclofenac (VOLTAREN) 75 MG EC tablet Take 75 mg by mouth 2 (two) times daily.  2  . diphenhydrAMINE (BENADRYL) 25 MG tablet Take 25 mg by mouth at bedtime.    Marland Kitchen. FLUoxetine (PROZAC) 20 MG tablet Take 20 mg by mouth daily.    . metoprolol succinate (TOPROL-XL) 25 MG 24 hr tablet Take 1 tablet (25 mg total) by mouth daily. 30 tablet 0  . Multiple Vitamin (MULTIVITAMIN WITH MINERALS) TABS tablet Take 1 tablet by mouth daily.    . Omega-3 Fatty Acids (FISH OIL PO) Take 1 capsule by mouth daily.    Marland Kitchen. omeprazole (PRILOSEC) 40 MG capsule Take 40 mg by  mouth daily.    Marland Kitchen. triamcinolone ointment (KENALOG) 0.1 % Apply 1 application topically 2 (two) times daily.    . vitamin B-12 (CYANOCOBALAMIN) 100 MCG tablet Take 100 mcg by mouth daily.    Marland Kitchen. aspirin EC 81 MG tablet Take 1 tablet (81 mg total) by mouth daily. 90 tablet 3   No current facility-administered medications for this visit.     Allergies:   Macrobid [nitrofurantoin monohyd macro]   Social History:  The patient  reports that she has never smoked. She has never used smokeless tobacco. She reports that she drinks alcohol. She reports that she does not use drugs.   Family History:  The patient's family history includes Cancer in her mother; Dementia in her mother; Diabetes in her paternal grandfather; Heart attack in her paternal grandfather; Hypertension in her father.    ROS:  Please see the history of present illness.   Otherwise, review of systems is positive for rash.   All other systems are reviewed and negative.    PHYSICAL EXAM: VS:  BP 124/80   Pulse 88   Ht 5\' 2"  (1.575 m)   Wt 184 lb (83.5 kg)   SpO2 96%   BMI 33.65 kg/m  , BMI Body mass index is 33.65 kg/m. GEN: Well nourished, well developed, in no acute distress  HEENT: normal  Neck: no JVD, carotid  bruits, or masses Cardiac: RRR; no murmurs, rubs, or gallops,no edema  Respiratory:  clear to auscultation bilaterally, normal work of breathing GI: soft, nontender, nondistended, + BS MS: no deformity or atrophy  Skin: warm and dry Neuro:  Strength and sensation are intact Psych: euthymic mood, full affect  EKG:  EKG is not ordered today. Personal review of the ekg ordered 2/26/19shows SR, , LPVB, rate 69  Recent Labs: 03/31/2017: Magnesium 2.0; TSH 1.347 10/09/2017: BUN 18; Creatinine, Ser 1.22; Hemoglobin 13.4; Platelets 322; Potassium 4.0; Sodium 137    Lipid Panel  No results found for: CHOL, TRIG, HDL, CHOLHDL, VLDL, LDLCALC, LDLDIRECT   Wt Readings from Last 3 Encounters:  02/24/18 184 lb (83.5  kg)  01/22/18 184 lb (83.5 kg)  12/30/17 184 lb (83.5 kg)      Other studies Reviewed: Additional studies/ records that were reviewed today include: TTE 01/07/18  Review of the above records today demonstrates:  - Left ventricle: The cavity size was normal. Systolic function was   normal. The estimated ejection fraction was 55%. Wall motion was   normal; there were no regional wall motion abnormalities. The   study is not technically sufficient to allow evaluation of LV   diastolic function. - Pulmonary arteries: Systolic pressure could not be accurately   estimated.  Myoview 01/22/18  The left ventricular ejection fraction is hyperdynamic (>65%).  Nuclear stress EF: 72%.  There was no ST segment deviation noted during stress.  Findings consistent with ischemia.  This is an intermediate risk study.   Distal anterior wall and apical ischemia SDS 8 TID suggested 1.25 EF normal 72%   ASSESSMENT AND PLAN:  1.  SVT: Had several episodes that occurred quarterly.  Controlled by Valsalva though has required IV adenosine at times.  She does have correlation with alcohol intake.  Would like ablation for SVT, but as she lives in [place], she would prefer to have it done there.  We will refer her to in [place] for SVT ablation.    Current medicines are reviewed at length with the patient today.   The patient does not have concerns regarding her medicines.  The following changes were made today:  none  Labs/ tests ordered today include:  Orders Placed This Encounter  Procedures  . Ambulatory referral to Cardiac Electrophysiology     Disposition:   FU with Will Camnitz as needed  Signed, Will Jorja Loa, MD  02/24/2018 10:36 AM     [facility] [address] [place] [address] [phone] (office) [phone] (fax)

## 2018-02-24 NOTE — Patient Instructions (Signed)
Medication Instructions:  Your physician recommends that you continue on your current medications as directed. Please refer to the Current Medication list given to you today.  Labwork: None ordered  Testing/Procedures: None ordered  Follow-Up: You have been referred to an Electrophysiologist in [place] to discuss SVT ablation.  * If you need a refill on your cardiac medications before your next appointment, please call your pharmacy.   *Please note that any paperwork needing to be filled out by the provider will need to be addressed at the front desk prior to seeing the provider. Please note that any FMLA, disability or other documents regarding health condition is subject to a $25.00 charge that must be received prior to completion of paperwork in the form of a money order or check.  Thank you for choosing [facility]!!   Dory HornSherri Elizabethanne Lusher, RN [phone]

## 2018-03-31 ENCOUNTER — Ambulatory Visit: Payer: BLUE CROSS/BLUE SHIELD | Admitting: Cardiovascular Disease

## 2018-04-01 ENCOUNTER — Encounter: Payer: Self-pay | Admitting: Cardiovascular Disease

## 2019-05-13 IMAGING — NM NM MISC PROCEDURE
9 series · 54 of 54 positions shown · non-contrast
Comparison: none

[Series 1: wbr rest · 6.40mm/px · 6 of 64 frames shown]
[frame 6/64]
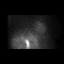
[frame 16/64]
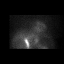
[frame 27/64]
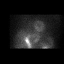
[frame 38/64]
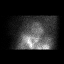
[frame 48/64]
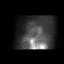
[frame 59/64]
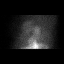

[Series 1: rest sax · 6.4mm · 6.40mm/px · 6 of 19 frames shown]
[frame 2/19]
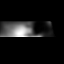
[frame 5/19]
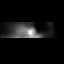
[frame 8/19]
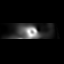
[frame 11/19]
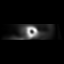
[frame 14/19]
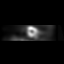
[frame 18/19]
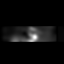

[Series 1: wbr_r-proj_st wbr rest · 6.40mm/px · 6 of 64 frames shown]
[frame 6/64]
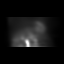
[frame 16/64]
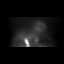
[frame 27/64]
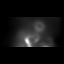
[frame 38/64]
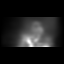
[frame 48/64]
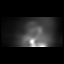
[frame 59/64]
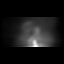

[Series 2: wbr_s-proj_st wbr stress-gsp · 6.40mm/px · 6 of 512 frames shown]
[frame 43/512]
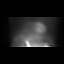
[frame 128/512]
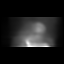
[frame 214/512]
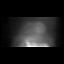
[frame 299/512]
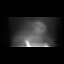
[frame 384/512]
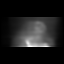
[frame 470/512]
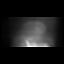

[Series 2: stress sax gs · 6.4mm · 6.40mm/px · 6 of 160 frames shown]
[frame 14/160]
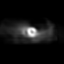
[frame 40/160]
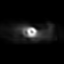
[frame 67/160]
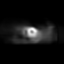
[frame 94/160]
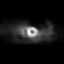
[frame 120/160]
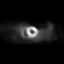
[frame 147/160]
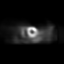

[Series 2: wbr stress-gsp · 6.40mm/px · 6 of 510 frames shown]
[frame 43/510  full-range]
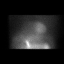
[frame 128/510  full-range]
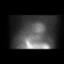
[frame 213/510  full-range]
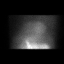
[frame 298/510  full-range]
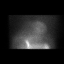
[frame 383/510  full-range]
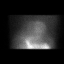
[frame 468/510  full-range]
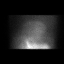

[Series 3: wbr stress-sum-em · 6.40mm/px · 6 of 64 frames shown]
[frame 6/64]
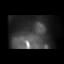
[frame 16/64]
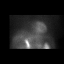
[frame 27/64]
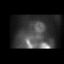
[frame 38/64]
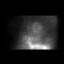
[frame 48/64]
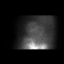
[frame 59/64]
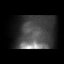

[Series 3: wbr_s-proj_st wbr stress-sum-em · 6.40mm/px · 6 of 64 frames shown]
[frame 6/64]
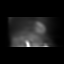
[frame 16/64]
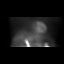
[frame 27/64]
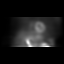
[frame 38/64]
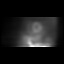
[frame 48/64]
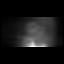
[frame 59/64]
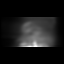

[Series 3: stress sax · 6.4mm · 6.40mm/px · 6 of 20 frames shown]
[frame 2/20]
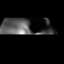
[frame 5/20]
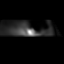
[frame 9/20]
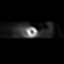
[frame 12/20]
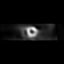
[frame 15/20]
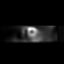
[frame 19/20]
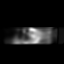

[54 of 54 positions shown; findings below may reference images not displayed]

Canned report from images found in remote index.

Refer to host system for actual result text.

## 2021-05-11 ENCOUNTER — Other Ambulatory Visit: Payer: Self-pay

## 2021-05-11 ENCOUNTER — Emergency Department (HOSPITAL_BASED_OUTPATIENT_CLINIC_OR_DEPARTMENT_OTHER)
Admission: EM | Admit: 2021-05-11 | Discharge: 2021-05-11 | Disposition: A | Discharge status: Home / Self Care | Payer: BLUE CROSS/BLUE SHIELD | Attending: Emergency Medicine | Admitting: Emergency Medicine

## 2021-05-11 ENCOUNTER — Encounter (HOSPITAL_BASED_OUTPATIENT_CLINIC_OR_DEPARTMENT_OTHER): Payer: Self-pay | Admitting: *Deleted

## 2021-05-11 ENCOUNTER — Emergency Department (HOSPITAL_BASED_OUTPATIENT_CLINIC_OR_DEPARTMENT_OTHER): Payer: BLUE CROSS/BLUE SHIELD

## 2021-05-11 DIAGNOSIS — Z7982 Long term (current) use of aspirin: Secondary | ICD-10-CM | POA: Insufficient documentation

## 2021-05-11 DIAGNOSIS — R197 Diarrhea, unspecified: Secondary | ICD-10-CM | POA: Diagnosis not present

## 2021-05-11 DIAGNOSIS — K449 Diaphragmatic hernia without obstruction or gangrene: Secondary | ICD-10-CM | POA: Diagnosis not present

## 2021-05-11 DIAGNOSIS — R1013 Epigastric pain: Secondary | ICD-10-CM | POA: Diagnosis present

## 2021-05-11 LAB — CBC
HCT: 39.2 % (ref 36.0–46.0)
Hemoglobin: 13.1 g/dL (ref 12.0–15.0)
MCH: 29.3 pg (ref 26.0–34.0)
MCHC: 33.4 g/dL (ref 30.0–36.0)
MCV: 87.7 fL (ref 80.0–100.0)
Platelets: 296 10*3/uL (ref 150–400)
RBC: 4.47 MIL/uL (ref 3.87–5.11)
RDW: 12.9 % (ref 11.5–15.5)
WBC: 10 10*3/uL (ref 4.0–10.5)
nRBC: 0 % (ref 0.0–0.2)

## 2021-05-11 LAB — COMPREHENSIVE METABOLIC PANEL
ALT: 16 U/L (ref 0–44)
AST: 13 U/L — ABNORMAL LOW (ref 15–41)
Albumin: 4.3 g/dL (ref 3.5–5.0)
Alkaline Phosphatase: 44 U/L (ref 38–126)
Anion gap: 10 (ref 5–15)
BUN: 11 mg/dL (ref 8–23)
CO2: 25 mmol/L (ref 22–32)
Calcium: 9.4 mg/dL (ref 8.9–10.3)
Chloride: 105 mmol/L (ref 98–111)
Creatinine, Ser: 0.63 mg/dL (ref 0.44–1.00)
GFR, Estimated: >60 mL/min (ref >60)
Glucose, Bld: 111 mg/dL — ABNORMAL HIGH (ref 70–99)
Potassium: 3.6 mmol/L (ref 3.5–5.1)
Sodium: 140 mmol/L (ref 135–145)
Total Bilirubin: 0.4 mg/dL (ref 0.3–1.2)
Total Protein: 7.1 g/dL (ref 6.5–8.1)

## 2021-05-11 LAB — URINALYSIS, ROUTINE W REFLEX MICROSCOPIC
Bilirubin Urine: NEGATIVE
Glucose, UA: NEGATIVE mg/dL
Hgb urine dipstick: NEGATIVE
Ketones, ur: NEGATIVE mg/dL
Leukocytes,Ua: NEGATIVE
Nitrite: NEGATIVE
Protein, ur: NEGATIVE mg/dL
Specific Gravity, Urine: 1.022 (ref 1.005–1.030)
pH: 6.5 (ref 5.0–8.0)

## 2021-05-11 LAB — LIPASE, BLOOD: Lipase: 12 U/L (ref 11–51)

## 2021-05-11 MED ORDER — SUCRALFATE 1 G PO TABS: 1.0000 g | ORAL_TABLET | Freq: Three times a day (TID) | ORAL | 0 refills | Status: DC

## 2021-05-11 NOTE — ED Provider Notes (Signed)
[facility] EMERGENCY DEPT Provider Note   CSN: [id number] Arrival date & time: 05/11/21  1300     History Chief Complaint  Patient presents with   Abdominal Pain    Christine Fletcher is a 60 y.o. female.   Abdominal Pain Associated symptoms: diarrhea and nausea   Associated symptoms: no chest pain, no shortness of breath and no vomiting   Patient presents with epigastric abdominal pain.  Has had for about a week.  States after she eats about half hour later she will get pain in her epigastric area.  Also states she will have diarrhea after it.  Pain lasts about 5 minutes and go away.  Nausea at times but primarily diarrhea and the pain.  No fevers or chills.  No weight loss.  States she is feels she may be eating less because of it.  Denies pregnancy.  Had had nausea in December and was post to see GI but then symptoms improved and she did not see them.  No blood in the diarrhea.    Past Medical History:  Diagnosis Date   Depression    High cholesterol    SVT (supraventricular tachycardia) (HCC)    Tachycardia     Patient Active Problem List   Diagnosis Date Noted   PSVT (paroxysmal supraventricular tachycardia) (HCC) 12/30/2017    Past Surgical History:  Procedure Laterality Date   ANKLE SURGERY     EYE SURGERY     three surgeries   UTERINE FIBROID SURGERY N/A      OB History   No obstetric history on file.     Family History  Problem Relation Age of Onset   Cancer Mother    Dementia Mother    Hypertension Father    Heart attack Paternal Grandfather    Diabetes Paternal Grandfather     Social History   Tobacco Use   Smoking status: Never   Smokeless tobacco: Never  Vaping Use   Vaping Use: Never used  Substance Use Topics   Alcohol use: Yes    Comment: occasionally   Drug use: No    Home Medications Prior to Admission medications   Medication Sig Start Date End Date Taking? Authorizing Provider  buPROPion (WELLBUTRIN XL) 300 MG 24  hr tablet Take 300 mg by mouth daily. 08/14/17  Yes [provider]  diclofenac (VOLTAREN) 75 MG EC tablet Take 75 mg by mouth 2 (two) times daily. 09/30/16  Yes [provider]  diphenhydrAMINE (BENADRYL) 25 MG tablet Take 25 mg by mouth at bedtime.   Yes [provider]  metoprolol succinate (TOPROL-XL) 25 MG 24 hr tablet Take 1 tablet (25 mg total) by mouth daily. 03/31/17  Yes Shaune Pollack, MD  Multiple Vitamin (MULTIVITAMIN WITH MINERALS) TABS tablet Take 1 tablet by mouth daily.   Yes [provider]  Omega-3 Fatty Acids (FISH OIL PO) Take 1 capsule by mouth daily.   Yes [provider]  omeprazole (PRILOSEC) 40 MG capsule Take 40 mg by mouth daily.   Yes [provider]  sucralfate (CARAFATE) 1 g tablet Take 1 tablet (1 g total) by mouth 4 (four) times daily -  with meals and at bedtime. 05/11/21  Yes Benjiman Core, MD  aspirin EC 81 MG tablet Take 1 tablet (81 mg total) by mouth daily. 02/24/18   Camnitz, Will Daphine Deutscher, MD  FLUoxetine (PROZAC) 20 MG tablet Take 20 mg by mouth daily.    [provider]  triamcinolone ointment (KENALOG) 0.1 %  Apply 1 application topically 2 (two) times daily.    [provider]  vitamin B-12 (CYANOCOBALAMIN) 100 MCG tablet Take 100 mcg by mouth daily.    [provider]    Allergies    Macrobid [nitrofurantoin monohyd macro]  Review of Systems   Review of Systems  Constitutional:  Positive for appetite change. Negative for unexpected weight change.  HENT:  Negative for congestion.   Respiratory:  Negative for shortness of breath.   Cardiovascular:  Negative for chest pain.  Gastrointestinal:  Positive for abdominal pain, diarrhea and nausea. Negative for vomiting.  Genitourinary:  Negative for flank pain.  Musculoskeletal:  Negative for back pain.  Skin:  Negative for rash.  Neurological:  Negative for weakness.  Psychiatric/Behavioral:  Negative for confusion.     Physical Exam Updated Vital Signs BP 133/79 (BP Location: Right Arm)   Pulse 62   Temp 98.7 F (37.1 C) (Oral)   Resp 18   Ht 5' 2.5" (1.588 m)   Wt 81.6 kg   SpO2 100%   BMI 32.40 kg/m   Physical Exam  ED Results / Procedures / Treatments   Labs (all labs ordered are listed, but only abnormal results are displayed) Labs Reviewed  COMPREHENSIVE METABOLIC PANEL - Abnormal; Notable for the following components:      Result Value   Glucose, Bld 111 (*)    AST 13 (*)    All other components within normal limits  LIPASE, BLOOD  CBC  URINALYSIS, ROUTINE W REFLEX MICROSCOPIC    EKG None  Radiology US Abdomen Limited RUQ (LIVER/GB)  Result Date: 05/11/2021 CLINICAL DATA:  Epigastric pain, nausea, vomiting and diarrhea for 2 days. EXAM: ULTRASOUND ABDOMEN LIMITED RIGHT UPPER QUADRANT COMPARISON:  None. FINDINGS: Gallbladder: No gallstones or wall thickening visualized. No sonographic Murphy sign noted by sonographer. Common bile duct: Diameter: 0.3 cm. Liver: No focal lesion. Echogenicity appears increased. Portal vein is patent on color Doppler imaging with normal direction of blood flow towards the liver. Other: None. IMPRESSION: No acute abnormality.  Negative for gallstones. Fatty infiltration of the liver. Electronically Signed   By: Drusilla Kanner M.D.   On: 05/11/2021 16:18    Procedures Procedures   Medications Ordered in ED Medications - No data to display  ED Course  I have reviewed the triage vital signs and the nursing notes.  Pertinent labs & imaging results that were available during my care of the patient were reviewed by me and considered in my medical decision making (see chart for details).    MDM Rules/Calculators/A&P                         Patient with epigastric pain.  Has had for a week.  Gets pain after she eats.  Then has diarrhea and pain improves.  Has had some previous abdominal issues.  Has not seen GI.  Lab work reassuring.  Reportedly has  known hiatal hernia.  Does have tenderness on upper abdomen.  Ultrasound done and reassuring.  With lab work being reassuring benign exam will discharge home with GI follow-up.  Will increase patient's Prilosec and add Carafate.  Discharge home. Final Clinical Impression(s) / ED Diagnoses Final diagnoses:  Epigastric pain    Rx / DC Orders ED Discharge Orders          Ordered    sucralfate (CARAFATE) 1 g tablet  3 times daily with meals & bedtime  05/11/21 1732             Benjiman Core, MD 05/12/21 Rich Fuchs

## 2021-05-11 NOTE — ED Notes (Signed)
Patient verbalizes understanding of discharge instructions. Opportunity for questioning and answers were provided. Patient discharged from ED.  °

## 2021-05-11 NOTE — ED Triage Notes (Signed)
Abd Pain and diarrhea for about a week. States she goes with diarrhea once or twice a day. Occ nausea. Pain worse after eating.

## 2021-05-11 NOTE — Discharge Instructions (Addendum)
Your lab work and ultrasound was reassuring.  Follow-up with either our gastroenterologist or the gastroenterologist were planning to see before.  He can also follow your primary care doctor.  Increase your Prilosec to twice a day for the next week.

## 2022-08-30 IMAGING — US US ABDOMEN LIMITED
1 series · 14 of 25 positions shown · non-contrast
Comparison: None.

CLINICAL DATA: Epigastric pain, nausea, vomiting and diarrhea for 2
days.

EXAM:
ULTRASOUND ABDOMEN LIMITED RIGHT UPPER QUADRANT

[Series 1: us abdomen limited ruq (liver/gb) · 14 of 56 slices shown]
[im 1/56]
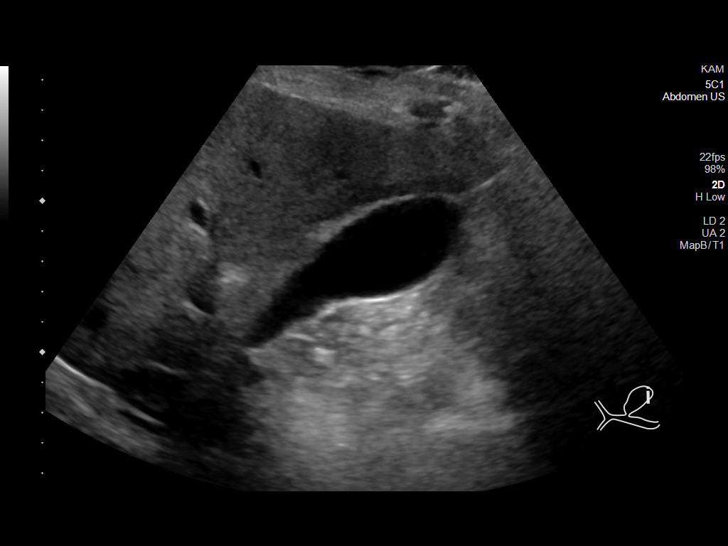
[im 5/56]
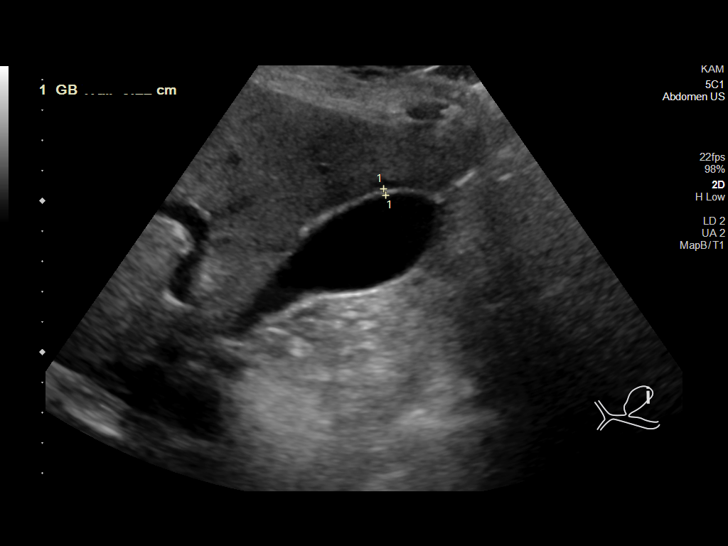
[im 10/56]
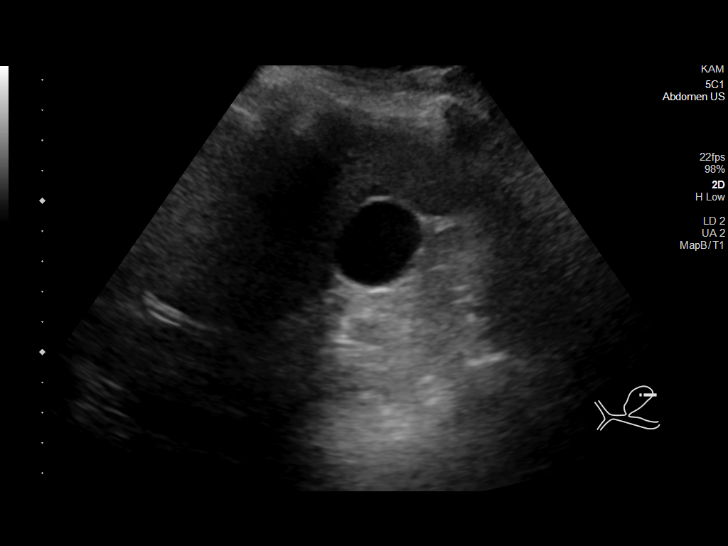
[im 14/56]
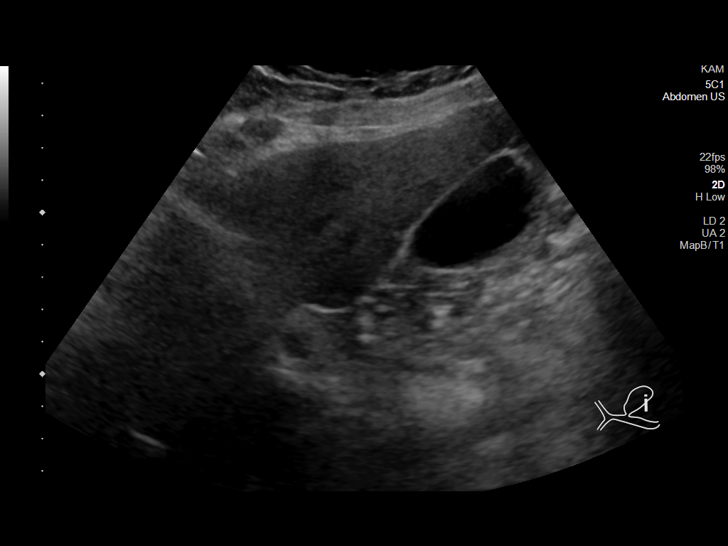
[im 19/56]
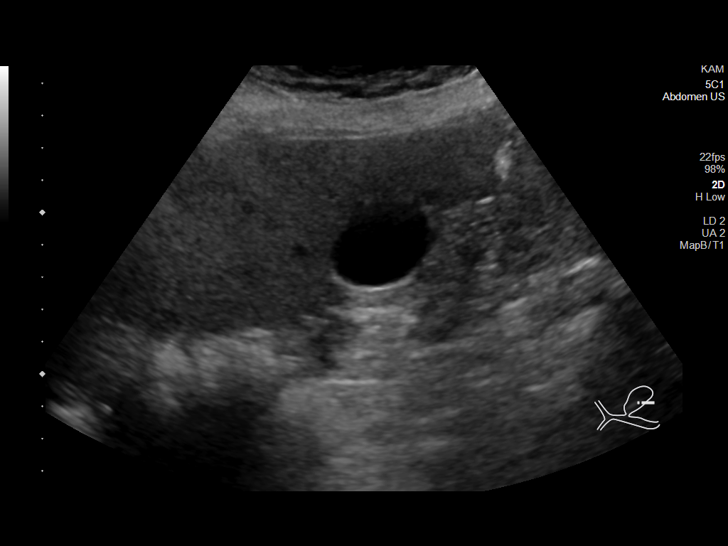
[im 21/56]
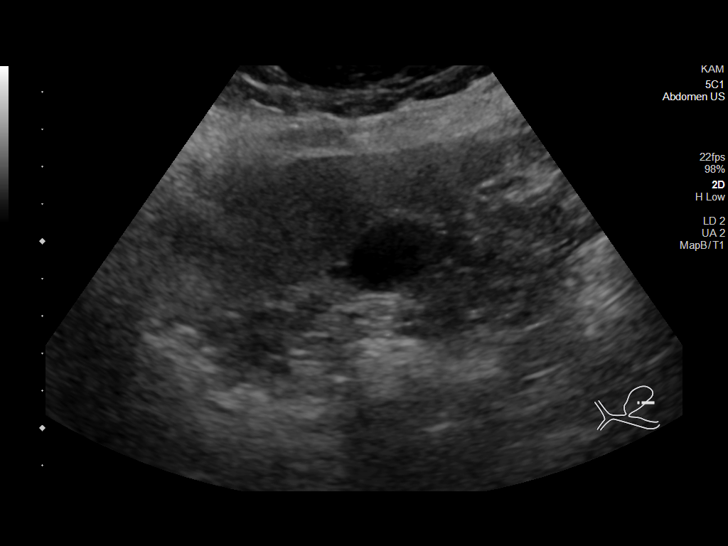
[im 26/56]
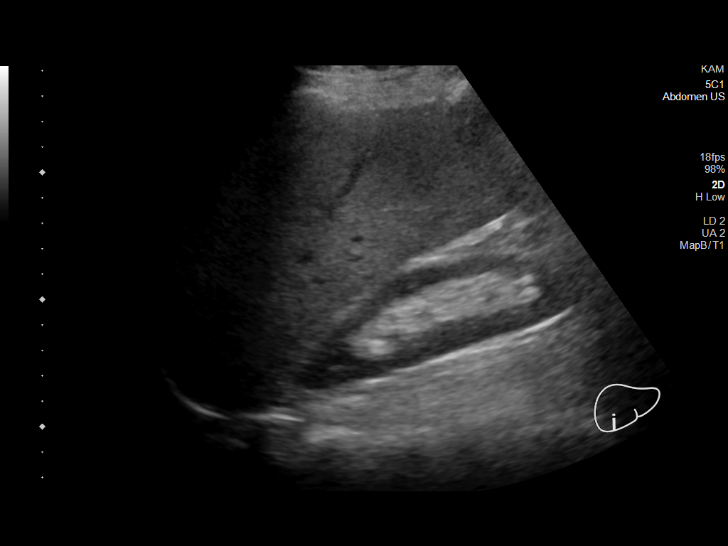
[im 30/56]
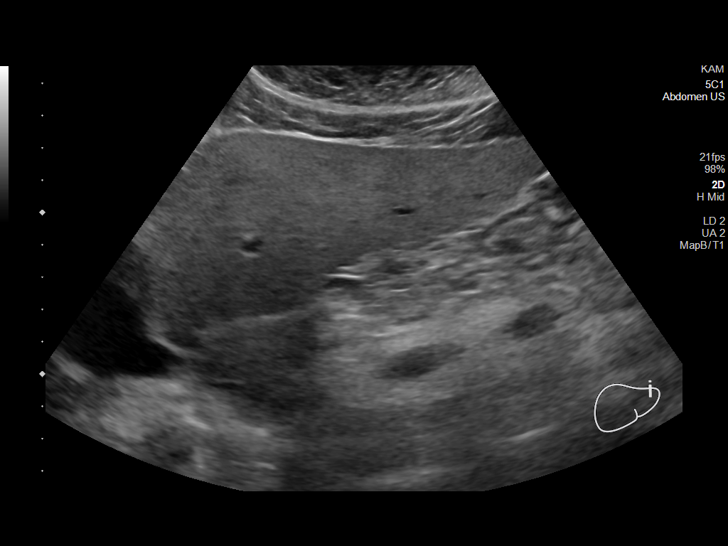
[im 35/56]
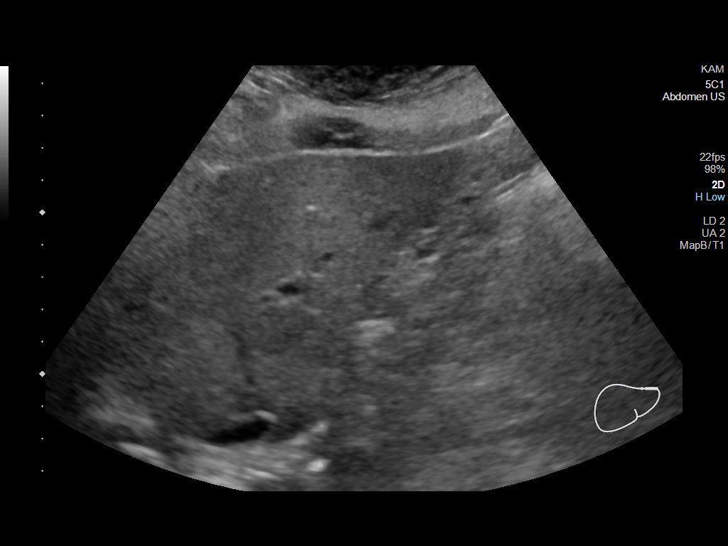
[im 37/56]
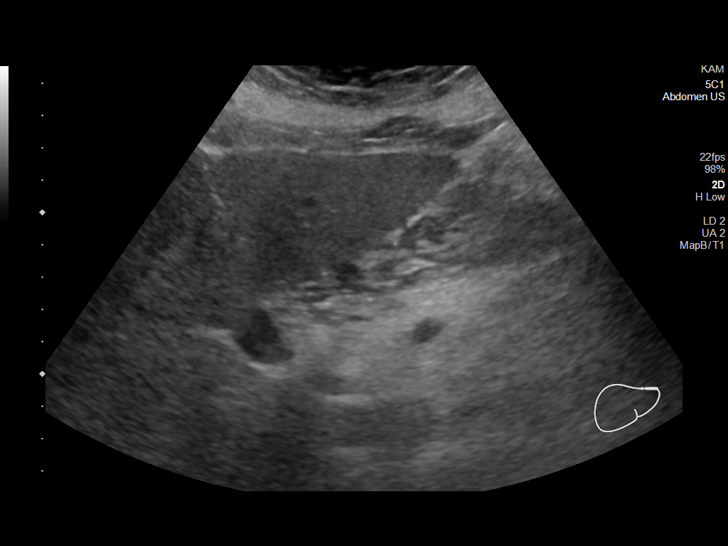
[im 42/56]
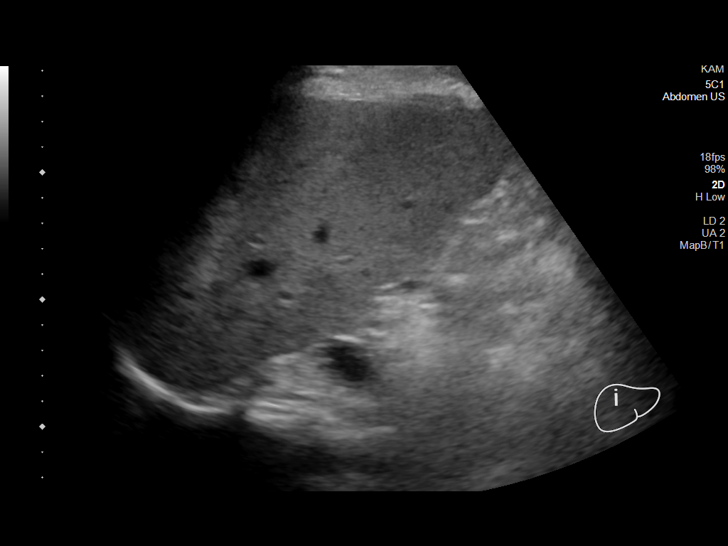
[im 46/56]
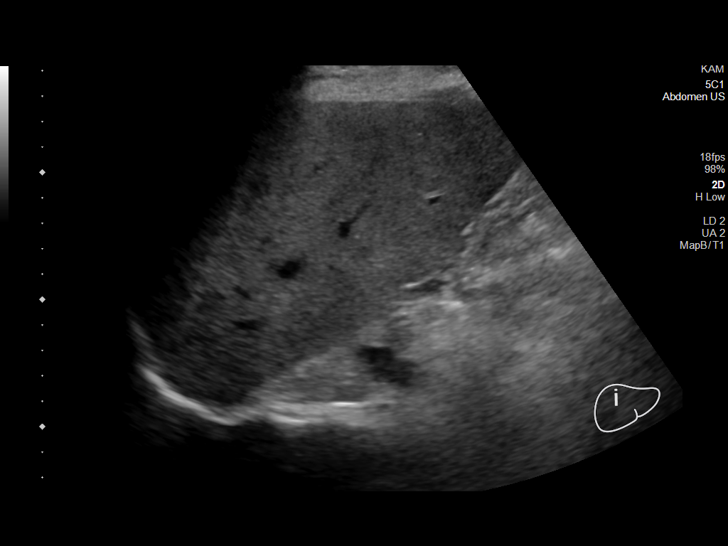
[im 51/56]
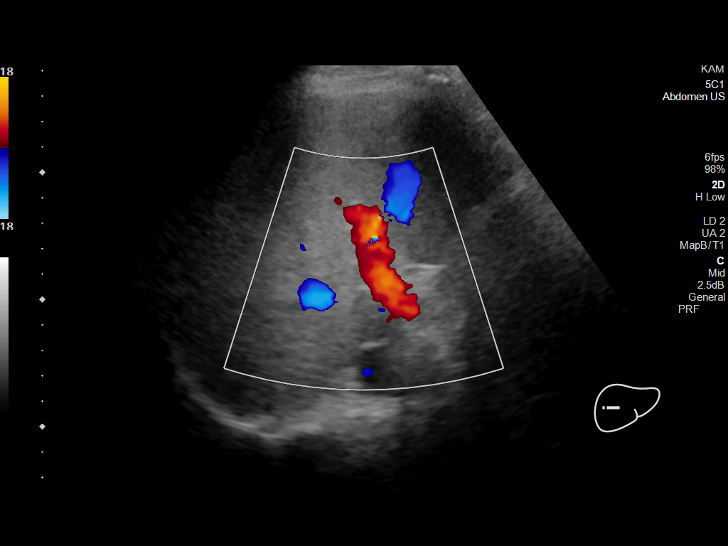
[im 56/56]
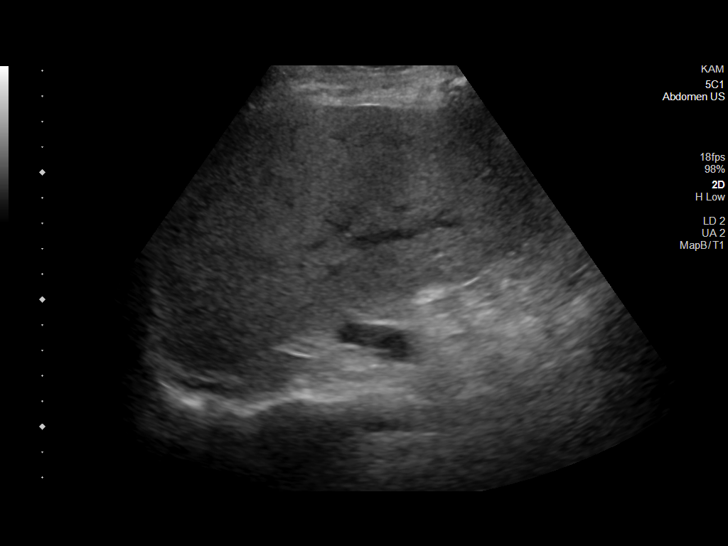

[14 of 25 positions shown; findings below may reference images not displayed]

FINDINGS: Gallbladder:

No gallstones or wall thickening visualized. No sonographic Murphy
sign noted by sonographer.

Common bile duct:

Diameter: 0.3 cm.

Liver:

No focal lesion. Echogenicity appears increased. Portal vein is
patent on color Doppler imaging with normal direction of blood flow
towards the liver.

Other: None.
IMPRESSION: No acute abnormality.  Negative for gallstones.

Fatty infiltration of the liver.

## 2023-08-05 ENCOUNTER — Ambulatory Visit (HOSPITAL_COMMUNITY)
Admission: EM | Admit: 2023-08-05 | Discharge: 2023-08-05 | Disposition: A | Discharge status: Home / Self Care | Payer: BLUE CROSS/BLUE SHIELD

## 2023-08-05 ENCOUNTER — Encounter (HOSPITAL_COMMUNITY): Payer: Self-pay

## 2023-08-05 DIAGNOSIS — S161XXA Strain of muscle, fascia and tendon at neck level, initial encounter: Secondary | ICD-10-CM | POA: Diagnosis not present

## 2023-08-05 NOTE — Discharge Instructions (Signed)
You can alternate between Tylenol and Ibuprofen every 4-6 hours as needed for pain. Otherwise you can continue using heating pad for pain. Return here or follow-up with [facility] if pain persists.

## 2023-08-05 NOTE — ED Provider Notes (Signed)
[facility]    CSN: [id number] Arrival date & time: 08/05/23  1359      History   Chief Complaint Chief Complaint  Patient presents with   Neck Pain    HPI Cella Cappello is a 62 y.o. female.   Patient presents with right sided neck pain that radiates to right shoulder upon waking this morning.  Patient states she did not go to work and is requesting a work note.  Patient reports taking ibuprofen and using heating pad with some relief.  Denies any known injury.  Denies chest pain, shortness of breath, numbness, tingling, weakness, blurred vision, and confusion.    Neck Pain Associated symptoms: no chest pain, no headaches, no numbness and no weakness     Past Medical History:  Diagnosis Date   Depression    High cholesterol    SVT (supraventricular tachycardia) (HCC)    Tachycardia     Patient Active Problem List   Diagnosis Date Noted   PSVT (paroxysmal supraventricular tachycardia) (HCC) 12/30/2017    Past Surgical History:  Procedure Laterality Date   ANKLE SURGERY     EYE SURGERY     three surgeries   UTERINE FIBROID SURGERY N/A     OB History   No obstetric history on file.      Home Medications    Prior to Admission medications   Medication Sig Start Date End Date Taking? Authorizing Provider  atorvastatin (LIPITOR) 40 MG tablet Take 40 mg by mouth daily.   Yes [provider]  dicyclomine (BENTYL) 20 MG tablet Take 20 mg by mouth 3 (three) times daily with meals as needed for spasms.   Yes [provider]  aspirin EC 81 MG tablet Take 1 tablet (81 mg total) by mouth daily. 02/24/18   Camnitz, Will Daphine Deutscher, MD  buPROPion (WELLBUTRIN XL) 300 MG 24 hr tablet Take 300 mg by mouth daily. 08/14/17   [provider]  diphenhydrAMINE (BENADRYL) 25 MG tablet Take 25 mg by mouth at bedtime.    [provider]  Multiple Vitamin (MULTIVITAMIN WITH MINERALS) TABS tablet Take 1 tablet by mouth daily.    [provider]  Omega-3 Fatty Acids (FISH OIL PO) Take 1 capsule by mouth daily.    [provider]  omeprazole (PRILOSEC) 40 MG capsule Take 40 mg by mouth daily.    [provider]  triamcinolone ointment (KENALOG) 0.1 % Apply 1 application topically 2 (two) times daily.    [provider]  vitamin B-12 (CYANOCOBALAMIN) 100 MCG tablet Take 100 mcg by mouth daily.    [provider]    Family History Family History  Problem Relation Age of Onset   Cancer Mother    Dementia Mother    Hypertension Father    Heart attack Paternal Grandfather    Diabetes Paternal Grandfather     Social History Social History   Tobacco Use   Smoking status: Never   Smokeless tobacco: Never  Vaping Use   Vaping status: Never Used  Substance Use Topics   Alcohol use: Yes    Comment: occasionally   Drug use: No     Allergies   Macrobid [nitrofurantoin monohyd macro]   Review of Systems Review of Systems  Respiratory:  Negative for shortness of breath.   Cardiovascular:  Negative for chest pain.  Musculoskeletal:  Positive for neck pain. Negative for back pain, gait problem and neck stiffness.  Neurological:  Negative for dizziness, weakness, light-headedness,  numbness and headaches.     Physical Exam Triage Vital Signs ED Triage Vitals  Encounter Vitals Group     BP 08/05/23 1414 135/71     Systolic BP Percentile --      Diastolic BP Percentile --      Pulse Rate 08/05/23 1414 82     Resp 08/05/23 1414 16     Temp 08/05/23 1414 98.5 F (36.9 C)     Temp Source 08/05/23 1414 Oral     SpO2 08/05/23 1414 94 %     Weight --      Height --      Head Circumference --      Peak Flow --      Pain Score 08/05/23 1413 6     Pain Loc --      Pain Education --      Exclude from Growth Chart --    No data found.  Updated Vital Signs BP 135/71 (BP Location: Right Arm)   Pulse 82   Temp 98.5 F (36.9 C) (Oral)   Resp 16   SpO2 94%   Visual  Acuity Right Eye Distance:   Left Eye Distance:   Bilateral Distance:    Right Eye Near:   Left Eye Near:    Bilateral Near:     Physical Exam Vitals and nursing note reviewed.  Constitutional:      General: She is awake. She is not in acute distress.    Appearance: Normal appearance. She is well-developed and well-groomed. She is not ill-appearing, toxic-appearing or diaphoretic.  HENT:     Head: Normocephalic.  Neck:     Comments: Mild movement pain with passive range of motion and mild muscular tenderness upon palpation. Musculoskeletal:        General: Normal range of motion.     Right shoulder: Tenderness present. No swelling, deformity or bony tenderness. Normal range of motion.     Left shoulder: Normal.     Cervical back: Normal range of motion. Tenderness present. No edema, erythema or rigidity. Pain with movement and muscular tenderness present. Normal range of motion.  Neurological:     Mental Status: She is alert.  Psychiatric:        Behavior: Behavior is cooperative.      UC Treatments / Results  Labs (all labs ordered are listed, but only abnormal results are displayed) Labs Reviewed - No data to display  EKG   Radiology No results found.  Procedures Procedures (including critical care time)  Medications Ordered in UC Medications - No data to display  Initial Impression / Assessment and Plan / UC Course  I have reviewed the triage vital signs and the nursing notes.  Pertinent labs & imaging results that were available during my care of the patient were reviewed by me and considered in my medical decision making (see chart for details).     Patient presented with right sided neck pain that radiates to right shoulder upon waking this morning.  Denies any known injury.  Upon assessment patient has mild pain with passive range of motion and mild muscular tenderness upon palpation to right side of neck and shoulder.  No other significant findings  upon assessment.  Recommended Tylenol ibuprofen for pain.  Discussed follow-up and return precautions. Final Clinical Impressions(s) / UC Diagnoses   Final diagnoses:  Strain of neck muscle, initial encounter     Discharge Instructions      You can alternate between Tylenol and  Ibuprofen every 4-6 hours as needed for pain. Otherwise you can continue using heating pad for pain. Return here or follow-up with [facility] if pain persists.     ED Prescriptions   None    PDMP not reviewed this encounter.   Wynonia Lawman A, NP 08/05/23 1515

## 2023-08-05 NOTE — ED Triage Notes (Signed)
Patient reports that she woke with right lateral neck pain that radiates into her right shoulder. Patient states, "I am mainly here to get anexcuse for missing work today.  Patient states she took Ibuprofen 400 mg at 0830 and used a heating pad.

## 2024-03-18 ENCOUNTER — Encounter (HOSPITAL_BASED_OUTPATIENT_CLINIC_OR_DEPARTMENT_OTHER): Payer: Self-pay | Admitting: Emergency Medicine

## 2024-03-18 ENCOUNTER — Emergency Department (HOSPITAL_BASED_OUTPATIENT_CLINIC_OR_DEPARTMENT_OTHER)

## 2024-03-18 ENCOUNTER — Emergency Department (HOSPITAL_BASED_OUTPATIENT_CLINIC_OR_DEPARTMENT_OTHER)
Admission: EM | Admit: 2024-03-18 | Discharge: 2024-03-18 | Disposition: A | Discharge status: Home / Self Care | Attending: Emergency Medicine | Admitting: Emergency Medicine

## 2024-03-18 ENCOUNTER — Other Ambulatory Visit: Payer: Self-pay

## 2024-03-18 DIAGNOSIS — Z7982 Long term (current) use of aspirin: Secondary | ICD-10-CM | POA: Insufficient documentation

## 2024-03-18 DIAGNOSIS — K5732 Diverticulitis of large intestine without perforation or abscess without bleeding: Secondary | ICD-10-CM | POA: Diagnosis not present

## 2024-03-18 DIAGNOSIS — K5792 Diverticulitis of intestine, part unspecified, without perforation or abscess without bleeding: Secondary | ICD-10-CM

## 2024-03-18 DIAGNOSIS — R1030 Lower abdominal pain, unspecified: Secondary | ICD-10-CM | POA: Diagnosis present

## 2024-03-18 DIAGNOSIS — E876 Hypokalemia: Secondary | ICD-10-CM | POA: Insufficient documentation

## 2024-03-18 LAB — COMPREHENSIVE METABOLIC PANEL WITH GFR
ALT: 12 U/L (ref 0–44)
AST: 16 U/L (ref 15–41)
Albumin: 4.1 g/dL (ref 3.5–5.0)
Alkaline Phosphatase: 44 U/L (ref 38–126)
Anion gap: 14 (ref 5–15)
BUN: 7 mg/dL — ABNORMAL LOW (ref 8–23)
CO2: 24 mmol/L (ref 22–32)
Calcium: 9.5 mg/dL (ref 8.9–10.3)
Chloride: 102 mmol/L (ref 98–111)
Creatinine, Ser: 0.79 mg/dL (ref 0.44–1.00)
GFR, Estimated: >60 mL/min (ref >60)
Glucose, Bld: 134 mg/dL — ABNORMAL HIGH (ref 70–99)
Potassium: 2.7 mmol/L — CL (ref 3.5–5.1)
Sodium: 140 mmol/L (ref 135–145)
Total Bilirubin: 0.6 mg/dL (ref 0.0–1.2)
Total Protein: 7.2 g/dL (ref 6.5–8.1)

## 2024-03-18 LAB — CBC
HCT: 35.3 % — ABNORMAL LOW (ref 36.0–46.0)
Hemoglobin: 12.3 g/dL (ref 12.0–15.0)
MCH: 30.2 pg (ref 26.0–34.0)
MCHC: 34.8 g/dL (ref 30.0–36.0)
MCV: 86.7 fL (ref 80.0–100.0)
Platelets: 266 10*3/uL (ref 150–400)
RBC: 4.07 MIL/uL (ref 3.87–5.11)
RDW: 12.9 % (ref 11.5–15.5)
WBC: 11.8 10*3/uL — ABNORMAL HIGH (ref 4.0–10.5)
nRBC: 0 % (ref 0.0–0.2)

## 2024-03-18 LAB — URINALYSIS, ROUTINE W REFLEX MICROSCOPIC
Bacteria, UA: NONE SEEN
Bilirubin Urine: NEGATIVE
Glucose, UA: NEGATIVE mg/dL
Hgb urine dipstick: NEGATIVE
Ketones, ur: NEGATIVE mg/dL
Nitrite: NEGATIVE
Specific Gravity, Urine: 1.01 (ref 1.005–1.030)
pH: 6 (ref 5.0–8.0)

## 2024-03-18 LAB — LIPASE, BLOOD: Lipase: 11 U/L (ref 11–51)

## 2024-03-18 MED ORDER — POTASSIUM CHLORIDE CRYS ER 20 MEQ PO TBCR
40.0000 meq | EXTENDED_RELEASE_TABLET | Freq: Once | ORAL | Status: AC
  Administered 2024-03-18: 40 meq via ORAL
  Filled 2024-03-18: qty 2

## 2024-03-18 MED ORDER — ONDANSETRON HCL 4 MG PO TABS: 4.0000 mg | ORAL_TABLET | Freq: Three times a day (TID) | ORAL | 0 refills | Status: AC | PRN

## 2024-03-18 MED ORDER — AMOXICILLIN-POT CLAVULANATE 875-125 MG PO TABS
1.0000 | ORAL_TABLET | Freq: Once | ORAL | Status: AC
  Administered 2024-03-18: 1 via ORAL
  Filled 2024-03-18: qty 1

## 2024-03-18 MED ORDER — POTASSIUM CHLORIDE CRYS ER 10 MEQ PO TBCR: 10.0000 meq | EXTENDED_RELEASE_TABLET | Freq: Two times a day (BID) | ORAL | 0 refills | Status: AC

## 2024-03-18 MED ORDER — AMOXICILLIN-POT CLAVULANATE 875-125 MG PO TABS: 1.0000 | ORAL_TABLET | Freq: Two times a day (BID) | ORAL | 0 refills | Status: AC

## 2024-03-18 MED ORDER — MAGNESIUM SULFATE 2 GM/50ML IV SOLN
2.0000 g | Freq: Once | INTRAVENOUS | Status: AC
  Administered 2024-03-18: 2 g via INTRAVENOUS
  Filled 2024-03-18: qty 50

## 2024-03-18 MED ORDER — SODIUM CHLORIDE 0.9 % IV BOLUS
1000.0000 mL | Freq: Once | INTRAVENOUS | Status: AC
  Administered 2024-03-18: 1000 mL via INTRAVENOUS

## 2024-03-18 MED ORDER — IOHEXOL 300 MG/ML  SOLN
100.0000 mL | Freq: Once | INTRAMUSCULAR | Status: AC | PRN
  Administered 2024-03-18: 85 mL via INTRAVENOUS

## 2024-03-18 MED ORDER — ONDANSETRON HCL 4 MG/2ML IJ SOLN
4.0000 mg | Freq: Once | INTRAMUSCULAR | Status: AC
  Administered 2024-03-18: 4 mg via INTRAVENOUS
  Filled 2024-03-18: qty 2

## 2024-03-18 MED ORDER — POTASSIUM CHLORIDE 10 MEQ/100ML IV SOLN
10.0000 meq | Freq: Once | INTRAVENOUS | Status: AC
  Administered 2024-03-18: 10 meq via INTRAVENOUS
  Filled 2024-03-18: qty 100

## 2024-03-18 NOTE — ED Provider Notes (Signed)
 [place] [facility] AT [facility] Provider Note   CSN: [id number] Arrival date & time: 03/18/24  1443     History  Chief Complaint  Patient presents with   Abdominal Pain    Christine Fletcher is a 63 y.o. female.  With a history of hyperlipidemia and uterine fibroids who presents to the ED for abdominal pain.  Some lower abdominal pain with nausea and vomiting yesterday.  Lower abdominal pain worse after vomiting.  Also notes increased urinary frequency and diarrhea for the last 3 days.  No nausea or vomiting today.  Has felt subjective fever and chills but has not measured her temperature at home.  Status post uterine fibroid surgery no other abdominal surgeries  Abdominal Pain      Home Medications Prior to Admission medications   Medication Sig Start Date End Date Taking? Authorizing Provider  amoxicillin-clavulanate (AUGMENTIN) 875-125 MG tablet Take 1 tablet by mouth every 12 (twelve) hours for 6 days. 03/18/24 03/24/24 Yes Sallyanne Creamer, DO  ondansetron  (ZOFRAN ) 4 MG tablet Take 1 tablet (4 mg total) by mouth every 8 (eight) hours as needed for nausea or vomiting. 03/18/24  Yes Rafael Bun A, DO  potassium chloride (KLOR-CON M) 10 MEQ tablet Take 1 tablet (10 mEq total) by mouth 2 (two) times daily for 7 days. 03/18/24 03/25/24 Yes Sallyanne Creamer, DO  aspirin  EC 81 MG tablet Take 1 tablet (81 mg total) by mouth daily. 02/24/18   Camnitz, Will Gaylyn Keas, MD  atorvastatin (LIPITOR) 40 MG tablet Take 40 mg by mouth daily.    [provider]  buPROPion (WELLBUTRIN XL) 300 MG 24 hr tablet Take 300 mg by mouth daily. 08/14/17   [provider]  dicyclomine (BENTYL) 20 MG tablet Take 20 mg by mouth 3 (three) times daily with meals as needed for spasms.    [provider]  diphenhydrAMINE (BENADRYL) 25 MG tablet Take 25 mg by mouth at bedtime.    [provider]  Multiple Vitamin (MULTIVITAMIN WITH MINERALS) TABS tablet Take 1 tablet  by mouth daily.    [provider]  Omega-3 Fatty Acids (FISH OIL PO) Take 1 capsule by mouth daily.    [provider]  omeprazole (PRILOSEC) 40 MG capsule Take 40 mg by mouth daily.    [provider]  triamcinolone ointment (KENALOG) 0.1 % Apply 1 application topically 2 (two) times daily.    [provider]  vitamin B-12 (CYANOCOBALAMIN) 100 MCG tablet Take 100 mcg by mouth daily.    [provider]      Allergies    Macrobid [nitrofurantoin monohyd macro]    Review of Systems   Review of Systems  Gastrointestinal:  Positive for abdominal pain.    Physical Exam Updated Vital Signs BP 113/71 (BP Location: Right Arm)   Pulse 80   Temp 99.4 F (37.4 C) (Oral)   Resp 18   Ht 5' 2.5" (1.588 m)   Wt 81.6 kg   SpO2 100%   BMI 32.38 kg/m  Physical Exam Vitals and nursing note reviewed.  HENT:     Head: Normocephalic and atraumatic.  Eyes:     Pupils: Pupils are equal, round, and reactive to light.  Cardiovascular:     Rate and Rhythm: Normal rate and regular rhythm.  Pulmonary:     Effort: Pulmonary effort is normal.     Breath sounds: Normal breath sounds.  Abdominal:     Palpations: Abdomen is soft.  Tenderness: There is abdominal tenderness in the right lower quadrant and suprapubic area.  Skin:    General: Skin is warm and dry.  Neurological:     Mental Status: She is alert.  Psychiatric:        Mood and Affect: Mood normal.     ED Results / Procedures / Treatments   Labs (all labs ordered are listed, but only abnormal results are displayed) Labs Reviewed  COMPREHENSIVE METABOLIC PANEL WITH GFR - Abnormal; Notable for the following components:      Result Value   Potassium 2.7 (*)    Glucose, Bld 134 (*)    BUN 7 (*)    All other components within normal limits  CBC - Abnormal; Notable for the following components:   WBC 11.8 (*)    HCT 35.3 (*)    All other components within normal limits  URINALYSIS,  ROUTINE W REFLEX MICROSCOPIC - Abnormal; Notable for the following components:   Protein, ur TRACE (*)    Leukocytes,Ua TRACE (*)    All other components within normal limits  LIPASE, BLOOD    EKG EKG Interpretation Date/Time:  Thursday Mar 18 2024 15:32:03 EDT Ventricular Rate:  91 PR Interval:  192 QRS Duration:  105 QT Interval:  361 QTC Calculation: 445 R Axis:   177  Text Interpretation: Sinus rhythm Right ventricular hypertrophy Baseline wander in lead(s) V5 Confirmed by Rafael Bun [phone]) on 03/18/2024 3:40:57 PM  Radiology CT ABDOMEN PELVIS W CONTRAST Result Date: 03/18/2024 CLINICAL DATA:  Right lower quadrant pain EXAM: CT ABDOMEN AND PELVIS WITH CONTRAST TECHNIQUE: Multidetector CT imaging of the abdomen and pelvis was performed using the standard protocol following bolus administration of intravenous contrast. RADIATION DOSE REDUCTION: This exam was performed according to the departmental dose-optimization program which includes automated exposure control, adjustment of the mA and/or kV according to patient size and/or use of iterative reconstruction technique. CONTRAST:  85mL OMNIPAQUE IOHEXOL 300 MG/ML  SOLN COMPARISON:  Ultrasound 05/11/2021 FINDINGS: Lower chest: Lung bases demonstrate no acute airspace disease. Large hiatal hernia with intrathoracic stomach, or [place] axial configuration of stomach but no evidence for an outlet obstruction. Hepatobiliary: No focal liver abnormality is seen. No gallstones, gallbladder wall thickening, or biliary dilatation. Pancreas: Unremarkable. No pancreatic ductal dilatation or surrounding inflammatory changes. Spleen: Normal in size without focal abnormality. Adrenals/Urinary Tract: Adrenal glands are normal. Kidneys show prominent extrarenal pelvises. The bladder is normal Stomach/Bowel: Stomach nonenlarged. No dilated small bowel. Negative appendix aside from dense intraluminal material. Diverticular disease of the left colon. Wall  thickening and pericolonic stranding involving the rectosigmoid colon. Vascular/Lymphatic: Aortic atherosclerosis. No enlarged abdominal or pelvic lymph nodes. Reproductive: Adnexa are unremarkable. Probable uterine fundal fibroid measuring 2.7 cm. Other: Negative for pelvic effusion or free air. Musculoskeletal: No acute or suspicious osseous abnormality. IMPRESSION: 1. Negative for acute appendicitis. 2. Wall thickening and pericolonic stranding involving the rectosigmoid colon, findings could be secondary to distal colitis versus diverticulitis. No evidence for perforation or abscess. 3. Large hiatal hernia with intrathoracic stomach. 4. Aortic atherosclerosis. Aortic Atherosclerosis (ICD10-I70.0). Electronically Signed   By: Esmeralda Hedge M.D.   On: 03/18/2024 18:43    Procedures Procedures    Medications Ordered in ED Medications  amoxicillin-clavulanate (AUGMENTIN) 875-125 MG per tablet 1 tablet (has no administration in time range)  sodium chloride  0.9 % bolus 1,000 mL (0 mLs Intravenous Stopped 03/18/24 1944)  ondansetron  (ZOFRAN ) injection 4 mg (4 mg Intravenous Given 03/18/24 1736)  potassium chloride 10 mEq  in 100 mL IVPB (0 mEq Intravenous Stopped 03/18/24 1944)  potassium chloride SA (KLOR-CON M) CR tablet 40 mEq (40 mEq Oral Given 03/18/24 1726)  magnesium sulfate IVPB 2 g 50 mL (0 g Intravenous Stopped 03/18/24 1944)  iohexol (OMNIPAQUE) 300 MG/ML solution 100 mL (85 mLs Intravenous Contrast Given 03/18/24 1758)    ED Course/ Medical Decision Making/ A&P Clinical Course as of 03/18/24 1952  Thu Mar 18, 2024  1607 Laboratory workup notable for hypokalemia of 2.7.  Will provide oral IV potassium repletion along with IV magnesium repletion.  Slight leukocytosis of 11.8.  UA not consistent with UTI.  Awaiting CT abdomen pelvis [MP]  1952 CT suggestive of diverticulitis.  No abscess or perforation.  Will discharge with Augmentin and give first dose here.  Will also discharge with Zofran  and  potassium supplementation.  She will follow-up with her primary care doctor for reevaluation and repeat blood work [MP]    Clinical Course User Index [MP] Sallyanne Creamer, DO                                 Medical Decision Making 63 year old female with history as above presenting for nausea vomiting diarrhea abdominal pain.  Dental pain worse after vomiting yesterday.  No nausea or vomiting today.  3 days of diarrhea subjective chills and fevers.  Some suprapubic and right lower quadrant tenderness on my exam  Differential diagnosis includes: Acute intra-abdominal infectious/inflammatory process such as appendicitis, diverticulitis, pancreatitis and cholecystitis Urinary tract infection Viral gastroenteritis  Will obtain laboratory workup including CBC with differential, metabolic panel, lipase and urinalysis along with CT abdomen pelvis  Amount and/or Complexity of Data Reviewed Labs: ordered. Radiology: ordered.  Risk Prescription drug management.           Final Clinical Impression(s) / ED Diagnoses Final diagnoses:  Hypokalemia  Diverticulitis    Rx / DC Orders ED Discharge Orders          Ordered    amoxicillin-clavulanate (AUGMENTIN) 875-125 MG tablet  Every 12 hours        03/18/24 1942    ondansetron  (ZOFRAN ) 4 MG tablet  Every 8 hours PRN        03/18/24 1942    potassium chloride (KLOR-CON M) 10 MEQ tablet  2 times daily        03/18/24 1949              Sallyanne Creamer, DO 03/18/24 1953

## 2024-03-18 NOTE — ED Triage Notes (Signed)
 Pt via pov from home with lower abdominal pain since yesterday. She reports that she feels like a pulled muscle; states "I may have pulled something when I threw up." Pt reports that she feels like she may have had fever earlier today. Pt states she has frequency due to retention; has had diarrhea x 3 days. No emesis today. Pt alert & oriented, nad noted.

## 2024-03-18 NOTE — ED Notes (Signed)
 Patient transported to CT

## 2024-03-18 NOTE — Discharge Instructions (Signed)
 You were seen in the emergency room for abdominal pain The CAT scan was suggestive of something called diverticulitis For this reason we gave you dose of antibiotics here You need to pick up the rest of the antibiotic from the pharmacy begin taking as directed Please take these with [place] yogurt or probiotics as discussed Your potassium level was low We gave you more potassium magnesium here Your potassium level need to be rechecked Please pick back up the potassium supplement from your pharmacy and begin taking as directed for the next week It is importantly follow-up with your primary care doctor to have your potassium level rechecked We have also called in for Zofran  for her to take as directed for nausea vomiting Follow-up with your primary care doctor within the next week Return to the emergency room for severe abdominal pain or any other concerns

## 2024-03-18 NOTE — ED Notes (Signed)
 Reviewed AVS/discharge instruction with patient. Time allotted for and all questions answered. Patient is agreeable for d/c and escorted to ed exit by staff.

## 2024-09-22 ENCOUNTER — Ambulatory Visit (HOSPITAL_BASED_OUTPATIENT_CLINIC_OR_DEPARTMENT_OTHER): Admitting: Family Medicine

## 2024-11-10 ENCOUNTER — Ambulatory Visit (HOSPITAL_COMMUNITY)
Admission: EM | Admit: 2024-11-10 | Discharge: 2024-11-10 | Disposition: A | Discharge status: Home / Self Care | Attending: Family Medicine | Admitting: Family Medicine

## 2024-11-10 ENCOUNTER — Other Ambulatory Visit: Payer: Self-pay

## 2024-11-10 ENCOUNTER — Encounter (HOSPITAL_COMMUNITY): Payer: Self-pay | Admitting: Emergency Medicine

## 2024-11-10 DIAGNOSIS — Z760 Encounter for issue of repeat prescription: Secondary | ICD-10-CM

## 2024-11-10 MED ORDER — OMEPRAZOLE 40 MG PO CPDR: 40.0000 mg | DELAYED_RELEASE_CAPSULE | Freq: Every day | ORAL | 2 refills | Status: AC

## 2024-11-10 MED ORDER — BUPROPION HCL ER (XL) 300 MG PO TB24: 300.0000 mg | ORAL_TABLET | Freq: Every day | ORAL | 2 refills | Status: AC

## 2024-11-10 MED ORDER — ATORVASTATIN CALCIUM 40 MG PO TABS: 40.0000 mg | ORAL_TABLET | Freq: Every day | ORAL | 2 refills | Status: AC

## 2024-11-10 NOTE — ED Triage Notes (Addendum)
 Bupropion , atorvastatin , omeprazole  are the 3 medicines patient is requesting.  Patient states she has been off of these for 2 months.  Patient reports missing her previously scheduled appt

## 2024-11-13 NOTE — ED Provider Notes (Signed)
 " [facility]   [id number] 11/10/24 Arrival Time: 1551  ASSESSMENT & PLAN:  1. Medication refill     Meds ordered this encounter  Medications   buPROPion  (WELLBUTRIN  XL) 300 MG 24 hr tablet    Sig: Take 1 tablet (300 mg total) by mouth daily.    Dispense:  30 tablet    Refill:  2   atorvastatin  (LIPITOR) 40 MG tablet    Sig: Take 1 tablet (40 mg total) by mouth daily.    Dispense:  30 tablet    Refill:  2   omeprazole  (PRILOSEC) 40 MG capsule    Sig: Take 1 capsule (40 mg total) by mouth daily.    Dispense:  30 capsule    Refill:  2   Keep appt with new PCP.  Reviewed expectations re: course of current medical issues. Questions answered. Outlined signs and symptoms indicating need for more acute intervention. Patient verbalized understanding. After Visit Summary given.   SUBJECTIVE: History from: patient. Christine Fletcher is a 64 y.o. female who presents requesting medication refill. No current concerns.  Current medical problems include: Past Medical History:  Diagnosis Date   Depression    High cholesterol    SVT (supraventricular tachycardia)    Tachycardia      Current Outpatient Medications (Cardiovascular):    atorvastatin  (LIPITOR) 40 MG tablet, Take 1 tablet (40 mg total) by mouth daily.  Current Outpatient Medications (Respiratory):    diphenhydrAMINE (BENADRYL) 25 MG tablet, Take 25 mg by mouth at bedtime.  Current Outpatient Medications (Analgesics):    aspirin  EC 81 MG tablet, Take 1 tablet (81 mg total) by mouth daily.  Current Outpatient Medications (Hematological):    vitamin B-12 (CYANOCOBALAMIN) 100 MCG tablet, Take 100 mcg by mouth daily.  Current Outpatient Medications (Other):    buPROPion  (WELLBUTRIN  XL) 300 MG 24 hr tablet, Take 1 tablet (300 mg total) by mouth daily.   dicyclomine (BENTYL) 20 MG tablet, Take 20 mg by mouth 3 (three) times daily with meals as needed for spasms.   Multiple Vitamin (MULTIVITAMIN WITH MINERALS)  TABS tablet, Take 1 tablet by mouth daily.   Omega-3 Fatty Acids (FISH OIL PO), Take 1 capsule by mouth daily.   omeprazole  (PRILOSEC) 40 MG capsule, Take 1 capsule (40 mg total) by mouth daily.   ondansetron  (ZOFRAN ) 4 MG tablet, Take 1 tablet (4 mg total) by mouth every 8 (eight) hours as needed for nausea or vomiting.   potassium chloride  (KLOR-CON  M) 10 MEQ tablet, Take 1 tablet (10 mEq total) by mouth 2 (two) times daily for 7 days.   triamcinolone ointment (KENALOG) 0.1 %, Apply 1 application topically 2 (two) times daily.  No current facility-administered medications for this encounter.  Current Medications[1]    OBJECTIVE:  Vitals:   11/10/24 1802  BP: (!) 146/67  Pulse: 80  Resp: 18  Temp: 98 F (36.7 C)  TempSrc: Oral  SpO2: 97%    General appearance: alert; no distress Psychological: alert and cooperative; normal mood and affect  Labs: Results for orders placed or performed during the hospital encounter of 03/18/24  Lipase, blood   Collection Time: 03/18/24  3:08 PM  Result Value Ref Range   Lipase 11 11 - 51 U/L  Comprehensive metabolic panel   Collection Time: 03/18/24  3:08 PM  Result Value Ref Range   Sodium 140 135 - 145 mmol/L   Potassium 2.7 (LL) 3.5 - 5.1 mmol/L   Chloride 102 98 - 111 mmol/L  CO2 24 22 - 32 mmol/L   Glucose, Bld 134 (H) 70 - 99 mg/dL   BUN 7 (L) 8 - 23 mg/dL   Creatinine, Ser 9.20 0.44 - 1.00 mg/dL   Calcium  9.5 8.9 - 10.3 mg/dL   Total Protein 7.2 6.5 - 8.1 g/dL   Albumin 4.1 3.5 - 5.0 g/dL   AST 16 15 - 41 U/L   ALT 12 0 - 44 U/L   Alkaline Phosphatase 44 38 - 126 U/L   Total Bilirubin 0.6 0.0 - 1.2 mg/dL   GFR, Estimated >39 >39 mL/min   Anion gap 14 5 - 15  CBC   Collection Time: 03/18/24  3:08 PM  Result Value Ref Range   WBC 11.8 (H) 4.0 - 10.5 K/uL   RBC 4.07 3.87 - 5.11 MIL/uL   Hemoglobin 12.3 12.0 - 15.0 g/dL   HCT 64.6 (L) 63.9 - 53.9 %   MCV 86.7 80.0 - 100.0 fL   MCH 30.2 26.0 - 34.0 pg   MCHC 34.8 30.0 -  36.0 g/dL   RDW 87.0 88.4 - 84.4 %   Platelets 266 150 - 400 K/uL   nRBC 0.0 0.0 - 0.2 %  Urinalysis, Routine w reflex microscopic -Urine, Unspecified Source   Collection Time: 03/18/24  3:08 PM  Result Value Ref Range   Color, Urine YELLOW YELLOW   APPearance CLEAR CLEAR   Specific Gravity, Urine 1.010 1.005 - 1.030   pH 6.0 5.0 - 8.0   Glucose, UA NEGATIVE NEGATIVE mg/dL   Hgb urine dipstick NEGATIVE NEGATIVE   Bilirubin Urine NEGATIVE NEGATIVE   Ketones, ur NEGATIVE NEGATIVE mg/dL   Protein, ur TRACE (A) NEGATIVE mg/dL   Nitrite NEGATIVE NEGATIVE   Leukocytes,Ua TRACE (A) NEGATIVE   RBC / HPF 0-5 0 - 5 RBC/hpf   WBC, UA 0-5 0 - 5 WBC/hpf   Bacteria, UA NONE SEEN NONE SEEN   Squamous Epithelial / HPF 0-5 0 - 5 /HPF   Mucus PRESENT    Hyaline Casts, UA PRESENT    Labs Reviewed - No data to display  Allergies[2]  Social History   Socioeconomic History   Marital status: Widowed    Spouse name: Not on file   Number of children: Not on file   Years of education: Not on file   Highest education level: Not on file  Occupational History   Not on file  Tobacco Use   Smoking status: Never   Smokeless tobacco: Never  Vaping Use   Vaping status: Never Used  Substance and Sexual Activity   Alcohol use: Yes    Comment: occasionally   Drug use: No   Sexual activity: Not on file  Other Topics Concern   Not on file  Social History Narrative   Not on file   Social Drivers of Health   Tobacco Use: Low Risk (11/10/2024)   Patient History    Smoking Tobacco Use: Never    Smokeless Tobacco Use: Never    Passive Exposure: Not on file  Financial Resource Strain: Not on file  Food Insecurity: Low Risk (06/22/2024)   Received from [facility]   Epic    Within the past 12 months, you worried that your food would run out before you got money to buy more: Never true    Within the past 12 months, the food you bought just didn't last and you didn't have money to get more. : Never  true  Transportation Needs: No Transportation Needs (  06/22/2024)   Received from [facility]   Transportation    In the past 12 months, has lack of reliable transportation kept you from medical appointments, meetings, work or from getting things needed for daily living? : No  Physical Activity: Not on file  Stress: Not on file  Social Connections: Unknown (03/16/2022)   Received from [facility]   Social Network    Social Network: Not on file  Intimate Partner Violence: Unknown (02/05/2022)   Received from [facility]   HITS    Physically Hurt: Not on file    Insult or Talk Down To: Not on file    Threaten Physical Harm: Not on file    Scream or Curse: Not on file  Depression (EYV7-0): Not on file  Alcohol Screen: Not on file  Housing: Low Risk (06/22/2024)   Received from [facility]   Epic    What is your living situation today?: I have a steady place to live    Think about the place you live. Do you have problems with any of the following? Choose all that apply:: None/None on this list  Utilities: Low Risk (06/22/2024)   Received from [facility]   Utilities    In the past 12 months has the electric, gas, oil, or water company threatened to shut off services in your home? : No  Health Literacy: Not on file   Family History  Problem Relation Age of Onset   Cancer Mother    Dementia Mother    Hypertension Father    Heart attack Paternal Grandfather    Diabetes Paternal Grandfather    Past Surgical History:  Procedure Laterality Date   ANKLE SURGERY     EYE SURGERY     three surgeries   UTERINE FIBROID SURGERY N/A        [1] No current facility-administered medications for this encounter.  Current Outpatient Medications:    aspirin  EC 81 MG tablet, Take 1 tablet (81 mg total) by mouth daily., Disp: 90 tablet, Rfl: 3   atorvastatin  (LIPITOR) 40 MG tablet, Take 1 tablet (40 mg total) by mouth daily., Disp: 30 tablet, Rfl: 2   buPROPion  (WELLBUTRIN  XL) 300 MG 24  hr tablet, Take 1 tablet (300 mg total) by mouth daily., Disp: 30 tablet, Rfl: 2   dicyclomine (BENTYL) 20 MG tablet, Take 20 mg by mouth 3 (three) times daily with meals as needed for spasms., Disp: , Rfl:    diphenhydrAMINE (BENADRYL) 25 MG tablet, Take 25 mg by mouth at bedtime., Disp: , Rfl:    Multiple Vitamin (MULTIVITAMIN WITH MINERALS) TABS tablet, Take 1 tablet by mouth daily., Disp: , Rfl:    Omega-3 Fatty Acids (FISH OIL PO), Take 1 capsule by mouth daily., Disp: , Rfl:    omeprazole  (PRILOSEC) 40 MG capsule, Take 1 capsule (40 mg total) by mouth daily., Disp: 30 capsule, Rfl: 2   ondansetron  (ZOFRAN ) 4 MG tablet, Take 1 tablet (4 mg total) by mouth every 8 (eight) hours as needed for nausea or vomiting., Disp: 12 tablet, Rfl: 0   potassium chloride  (KLOR-CON  M) 10 MEQ tablet, Take 1 tablet (10 mEq total) by mouth 2 (two) times daily for 7 days., Disp: 14 tablet, Rfl: 0   triamcinolone ointment (KENALOG) 0.1 %, Apply 1 application topically 2 (two) times daily., Disp: , Rfl:    vitamin B-12 (CYANOCOBALAMIN) 100 MCG tablet, Take 100 mcg by mouth daily., Disp: , Rfl:  [2]  Allergies Allergen Reactions  Macrobid [Nitrofurantoin Monohyd Macro] Itching     Jonasia Coiner, MD 11/13/24 [phone]  "

## 2024-12-07 ENCOUNTER — Ambulatory Visit (HOSPITAL_BASED_OUTPATIENT_CLINIC_OR_DEPARTMENT_OTHER)

## 2024-12-30 ENCOUNTER — Ambulatory Visit (HOSPITAL_BASED_OUTPATIENT_CLINIC_OR_DEPARTMENT_OTHER)
